# Patient Record
Sex: Female | Born: 1990 | Hispanic: Yes | Marital: Married | Smoking: Never smoker
Health system: Southern US, Community
[De-identification: ages and names within clinical notes are randomized; demographics above are authoritative.]

## PROBLEM LIST (undated history)

## (undated) DIAGNOSIS — E282 Polycystic ovarian syndrome: Secondary | ICD-10-CM

## (undated) HISTORY — DX: Polycystic ovarian syndrome: E28.2

## (undated) HISTORY — PX: NO PAST SURGERIES: SHX2092

---

## 2015-11-02 ENCOUNTER — Ambulatory Visit (INDEPENDENT_AMBULATORY_CARE_PROVIDER_SITE_OTHER): Payer: BLUE CROSS/BLUE SHIELD

## 2015-11-02 ENCOUNTER — Encounter: Payer: Self-pay | Admitting: Obstetrics and Gynecology

## 2015-11-02 ENCOUNTER — Ambulatory Visit (INDEPENDENT_AMBULATORY_CARE_PROVIDER_SITE_OTHER): Payer: BLUE CROSS/BLUE SHIELD | Admitting: Obstetrics and Gynecology

## 2015-11-02 VITALS — BP 122/82 | HR 91 | Ht 63.0 in | Wt 194.9 lb

## 2015-11-02 DIAGNOSIS — E669 Obesity, unspecified: Secondary | ICD-10-CM | POA: Diagnosis not present

## 2015-11-02 DIAGNOSIS — N970 Female infertility associated with anovulation: Secondary | ICD-10-CM | POA: Diagnosis not present

## 2015-11-02 DIAGNOSIS — E282 Polycystic ovarian syndrome: Secondary | ICD-10-CM | POA: Diagnosis not present

## 2015-11-02 DIAGNOSIS — E66811 Obesity, class 1: Secondary | ICD-10-CM

## 2015-11-02 MED ORDER — MEDROXYPROGESTERONE ACETATE 10 MG PO TABS: 10.0000 mg | ORAL_TABLET | Freq: Every day | ORAL | Status: DC

## 2015-11-02 NOTE — Patient Instructions (Signed)
1. You will begin a medication called Provera.  You will take this medication for 7 days each month.  Begin next dose approximately 21 days from initial dose each month.  2. You will need to keep a menstrual calendar.  Be sure to mark the first day of your cycle each month.  3. On the 3rd day of your cycle (3rd day of bleeding) and 21st day of your cycle, you will need to return for lab work.  4. You will be scheduled for an ultrasound a to evaluate your pelvic organs. 5. Once you have begun having regular cycles, you will need to be sure to have intercourse at least once daily specificall on Days 11-14 of your cycle.  Do not immediately get up after intercourse, remain lying down for 30 minutes.

## 2015-11-02 NOTE — Progress Notes (Signed)
GYNECOLOGY CLINIC PROGRESS NOTE  Subjective:    Kristina Rubio is a 24 y.o. female who presents for evaluation of infertility. Patient and partner have been attempting conception for 2 years.  Patient's last menstrual period was 08/21/2015 (within days).    Marital status: single. Pregnancies with current partner: no.  Menstrual and Endocrine History LMP Patient's last menstrual period was 08/21/2015 (within days).  Menarche 13  Shortest interval 27  Longest interval 70  days  Duration of flow 3-14 days  Heavy menses no  Clots yes  Intermenstrual bleeding no  Postcoital bleeding no  Dysmenorrhea yes (mild)  Amenorrhea yes for 2 cycles  Weight change yes, started at ~ 2 years ago  Hirsutism no  Balding no  Acne no  Galactorrhea no   Obstetrical History Never pregnant  Gynecologic History Last PAP 01/2015  Previous abdominal or pelvic surgery no  Pelvic pain no  Endometriosis no  Hot flashes no  DES exposure no  Abnormal Pap no  Cervix Cryo/cone no  Sexually transmitted diseases no  Pelvic inflammatory disease no   Infertility and Endocrine Studies Patient has not had any infertility workup thus far.  Does report workup for PCOS (labs).  Notes being told of "elevated testosterone"   Sexual History Frequency 3 or 4 times per weeks  Satisfied yes  Dyspareunia yes, deep  Use of lubricant no  Douching no  Number of lifetime sex partners 3   Contraception Other: h/o patch, OCP use  Family History Thyroid problems  no  Heart condition or high blood pressure  no  Blood clot or stroke  no  Diabetes  no  Cancer  no  Birth defects/inherited diseases  no  Infectious diseases (mumps, TB, rubella)  no  Other medical problems  no   Habits Cigarettes:    Female-  no    Partner - no Alcohol:    Female -  yes, occasional    Partner - yes, occasional Marijuana:   Female - no   Partner - no   Past Medical History  Diagnosis Date  . PCOS (polycystic ovarian  syndrome)     Family History  Problem Relation Age of Onset  . Anemia Mother     History reviewed. No pertinent past surgical history.   Social History   Social History  . Marital Status: Single    Spouse Name: N/A  . Number of Children: N/A  . Years of Education: N/A   Occupational History  . Not on file.   Social History Main Topics  . Smoking status: Never Smoker   . Smokeless tobacco: Not on file  . Alcohol Use: No  . Drug Use: No  . Sexual Activity: Yes    Birth Control/ Protection: None   Other Topics Concern  . Not on file   Social History Narrative  . No narrative on file    No Known Allergies  No current outpatient prescriptions on file prior to visit.   No current facility-administered medications on file prior to visit.    Review of Systems Pertinent items noted in HPI and remainder of comprehensive ROS otherwise negative.   Female History and Exam Name:  Kristina Rubio     Age: [age] Education: Some high school  Marital History: co-habitating # of years with this partner: 3   Paternity of Pregnancies: Number with this partner: 0 Number with other partners: 0 Age of youngest child: not applicable  Urologic History: Infection no  STD no  Mumps no  Varicocele no  Semen analysis no  Undescended testes no  Testicular trauma no  Genital surgery no  Ejaculatory problem no  Impotence no         Objective:    Female Exam BP 122/82 mmHg  Pulse 91  Ht  (1.6 m)  Wt 194 lb 14.4 oz (88.406 kg)  BMI 34.53 kg/m2  LMP 08/21/2015 (Within Days) Wt Readings from Last 1 Encounters:  11/02/15 194 lb 14.4 oz (88.406 kg)   General Appearance:    Alert, cooperative, no distress, appears stated age, obese  Head:    Normocephalic, without obvious abnormality, atraumatic  Eyes:    PERRL, conjunctiva/corneas clear, EOM's intact, both eyes  Ears:    Normal external ear canals, both ears  Nose:   Nares normal, septum midline, mucosa normal, no  drainage or sinus tenderness  Throat:   Lips, mucosa, and tongue normal; teeth and gums normal  Neck:   Supple, symmetrical, trachea midline, no adenopathy; thyroid:  no enlargement/tenderness/nodules; no carotid bruit or JVD  Back:     Symmetric, no curvature, ROM normal, no CVA tenderness  Lungs:     Clear to auscultation bilaterally, respirations unlabored  Chest Wall:    No tenderness or deformity   Heart:    Regular rate and rhythm, S1 and S2 normal, no murmur, rub or gallop  Breast Exam:    No tenderness, masses, or nipple abnormality  Abdomen:     Soft, non-tender, bowel sounds active all four quadrants, no masses, no organomegaly  Genitalia:    Normal female external genitalia, vagina with normal mucosa, scant white thin discharge, no odor.  Rectovaginal septum normal.  Cervix normal appearing, nulliparous, without lesion, discharge or tenderness.  Uterus normal size, shape, mobile.  Adnexae without masses or tenderness bilaterally.   Rectal:    deferred  Extremities:   Extremities normal, atraumatic, no cyanosis or edema  Pulses:   2+ and symmetric all extremities  Skin:   Skin color, texture, turgor normal, no rashes or lesions  Lymph nodes:   Cervical, supraclavicular, and axillary nodes normal  Neurologic:   CNII-XII intact, normal strength, sensation and reflexes    throughout     Female Exam Female genital exam: not performed   Assessment:    Irregular periods due to ovulation factor and obesity.   PCOS  Plan:    FSH, LH, Progesterone 3 times in midluteal phase and Ultrasound of pelvis. Will need to get records of labs from [facility].  Discussed use of Provera tablets monthly to make cycles occur regularly.  Discussed timed coitus, weight management, period tracking. All questions answered. Educational materials given to patient. Follow up in 3 months.     Hildred Laser, MD [facility]

## 2015-11-11 ENCOUNTER — Telehealth: Payer: Self-pay | Admitting: Obstetrics and Gynecology

## 2015-11-11 NOTE — Telephone Encounter (Signed)
Dr.Cherry it looks like you have reviewed this, can you tell me how to advise patient.

## 2015-11-11 NOTE — Telephone Encounter (Signed)
LM for pt informing her of u/s findings and the need to follow up.

## 2015-11-11 NOTE — Telephone Encounter (Signed)
-----   Message from Hildred LaserAnika Cherry, MD sent at 11/11/2015 11:22 AM EST ----- Please inform patient that her ultrasound does appear to have findings significant for PCOS.  Can continue on current course of treatment (Provera for cycle induction), and f/u at next scheduled appointment.

## 2015-11-11 NOTE — Telephone Encounter (Signed)
PT CALLED AND WAS SEEN ABOUT A MONTH AGO, AND SHE WAS HERE FOR AN US ON 11/30 AND SHE HAS NOT HEARD BACK ABOUT THE RESULTS OF IT.

## 2015-12-27 ENCOUNTER — Telehealth: Payer: Self-pay | Admitting: Obstetrics and Gynecology

## 2015-12-27 NOTE — Telephone Encounter (Signed)
Started the provera back in november and she has taken it every month since and she is not sure if she needs to take it the 31st of this month because she has started spotting. She normally starts the 31st of every month and she is just unsure since she has started spotting does she need to take it, she is unsure what Dr Valentino Saxon told her back in Nov.

## 2015-12-28 NOTE — Telephone Encounter (Signed)
Called pt Kristina Rubio for her informing her that Provera is to initiate her cycle, therefore if she started her cycle without the medication there is no need to take it. Advised pt to call back if she has more questions.

## 2016-03-01 ENCOUNTER — Ambulatory Visit: Payer: BLUE CROSS/BLUE SHIELD | Admitting: Obstetrics and Gynecology

## 2016-03-22 ENCOUNTER — Ambulatory Visit (INDEPENDENT_AMBULATORY_CARE_PROVIDER_SITE_OTHER): Payer: Managed Care, Other (non HMO) | Admitting: Obstetrics and Gynecology

## 2016-03-22 VITALS — BP 132/78 | HR 86 | Ht 63.0 in | Wt 191.4 lb

## 2016-03-22 DIAGNOSIS — N970 Female infertility associated with anovulation: Secondary | ICD-10-CM

## 2016-03-22 DIAGNOSIS — E282 Polycystic ovarian syndrome: Secondary | ICD-10-CM | POA: Diagnosis not present

## 2016-03-22 MED ORDER — CLOMIPHENE CITRATE 50 MG PO TABS: 50.0000 mg | ORAL_TABLET | Freq: Every day | ORAL | Status: DC

## 2016-03-24 ENCOUNTER — Encounter: Payer: Self-pay | Admitting: Obstetrics and Gynecology

## 2016-03-24 NOTE — Progress Notes (Signed)
    GYNECOLOGY PROGRESS NOTE  Subjective:    Patient ID: Ellwood DenseStephanie Saldana, female    DOB: 12/31/1990, 25 y.o.   MRN: [id number]  HPI  Patient is a 25 y.o. G0P0000 female who presents for 3 month f/u of infertility and anovulation.  Patient notes that cycles have been regular with use of Provera tablets, however most recent cycle lasted only 2 days (usually lasts 4-5). Patient's last menstrual period was 03/18/2016.  Is performing timed coitus around fertile period and tracking menstrual cycles.    The following portions of the patient's history were reviewed and updated as appropriate: allergies, current medications, past family history, past medical history, past social history, past surgical history and problem list.  Review of Systems Pertinent items noted in HPI and remainder of comprehensive ROS otherwise negative.   Objective:   Blood pressure 132/78, pulse 86, height 5\' 3"  (1.6 m), weight 191 lb 6.4 oz (86.818 kg), last menstrual period 03/18/2016. General appearance: alert and no distress Exam deferred.   Assessment:  Infertility associated with anovulation PCOS (polycystic ovarian syndrome)  Plan:   Patient instructed to take pregnancy test if menstrual cycle does not self initiate, prior to starting next round of Provera tablets.  Also, can begin with Clomid induction. Prescribed Clomid 50 mg.  Given instructions on use of Clomid, reviewed side effects.  To f/u in 3-6 additional months. Will increase dose if no conception occurs.    Hildred LaserAnika Ivery Michalski, MD [facility]

## 2016-06-11 ENCOUNTER — Other Ambulatory Visit: Payer: Self-pay | Admitting: Obstetrics and Gynecology

## 2016-08-16 ENCOUNTER — Telehealth: Payer: Self-pay | Admitting: Obstetrics and Gynecology

## 2016-08-16 NOTE — Telephone Encounter (Signed)
PT CALLED AND SHE WAS TOLD TO CALL DR CHERRY IF THE MEDICATION SHE WAS PUT ON WAS NOT DOING ANYTHING AND SHE WOULD INCREASE THE DOSE, AND PT IS STATING THAT THE MEDICATION IS NOT DOING ANYTHING, SHE IS ON A MEDICATION TO HELP WITH GETTING PREGNANT, PT DID STATE SHE WOULD LIKE  A CALL BACK.

## 2016-08-16 NOTE — Telephone Encounter (Signed)
Called pt she states that she was told if medication was not working to achieve pregnancy Dr.Cherry would increase dose. Advised pt that she is due for her f/u appointment and medication can be addressed at that visit.

## 2016-09-20 ENCOUNTER — Other Ambulatory Visit: Payer: Self-pay | Admitting: Obstetrics and Gynecology

## 2016-09-20 MED ORDER — CLOMIPHENE CITRATE 50 MG PO TABS: 100.0000 mg | ORAL_TABLET | Freq: Every day | ORAL | 4 refills | Status: DC

## 2016-09-25 ENCOUNTER — Ambulatory Visit: Payer: Managed Care, Other (non HMO) | Admitting: Obstetrics and Gynecology

## 2016-09-25 ENCOUNTER — Ambulatory Visit (INDEPENDENT_AMBULATORY_CARE_PROVIDER_SITE_OTHER): Payer: Managed Care, Other (non HMO) | Admitting: Obstetrics and Gynecology

## 2016-10-03 ENCOUNTER — Ambulatory Visit (INDEPENDENT_AMBULATORY_CARE_PROVIDER_SITE_OTHER): Payer: Managed Care, Other (non HMO) | Admitting: Obstetrics and Gynecology

## 2016-10-03 ENCOUNTER — Encounter: Payer: Self-pay | Admitting: Obstetrics and Gynecology

## 2016-10-03 VITALS — BP 111/77 | HR 80 | Wt 191.0 lb

## 2016-10-03 DIAGNOSIS — N970 Female infertility associated with anovulation: Secondary | ICD-10-CM

## 2016-10-03 DIAGNOSIS — E282 Polycystic ovarian syndrome: Secondary | ICD-10-CM

## 2016-10-03 DIAGNOSIS — Z319 Encounter for procreative management, unspecified: Secondary | ICD-10-CM | POA: Diagnosis not present

## 2016-10-03 MED ORDER — METFORMIN HCL 500 MG PO TABS: ORAL_TABLET | ORAL | 5 refills | Status: DC

## 2016-10-03 NOTE — Progress Notes (Signed)
    GYNECOLOGY PROGRESS NOTE  Subjective:    Patient ID: Kristina Rubio, female    DOB: 05/28/1991, 25 y.o.   MRN: [id number]  HPI  Patient is a 25 y.o. G0P0000 female who presents for f/u of infertility.  Patient is currently undergoing treatment for infertility with Clomid. She has been on Clomid 50 mg for the past 3 months and has now increased to 100 mg this month. Patient still noting that she has to take Provera to bring on menses. Has not completed day 21 progesterone levels and is currently not using an ovulation kits. Patient notes that she is engaging in timed coitus around time of anticipated ovulation. Is using a phone app to track her menstrual cycle.  Patient's last menstrual period was 08/31/2016.   The following portions of the patient's history were reviewed and updated as appropriate: allergies, current medications, past family history, past medical history, past social history, past surgical history and problem list.  Review of Systems Pertinent items noted in HPI and remainder of comprehensive ROS otherwise negative.   Objective:   Blood pressure 111/77, pulse 80, weight 191 lb (86.6 kg), last menstrual period 09/30/2016. 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 We'll also prescribe metformin as adjuvant therapy. Discussed the importance of patient either having day 21 progesterone draws, or using an ovulation kit to verify that she is indeed ovulating while on this medication. Patient notes understanding.  Timed coitus, with intercourse occurring between days 10 15 of her menstrual cycle. - To continue using Provera for inducing withdrawal bleed each month. - If no success with medication the next 2-3 months, will have patient stop medication for a while and continue with further workup (i.e. semen analysis, HSG).   Can also consider switching to alternative ovulation induction medication, such as Femara.  A total of 15 minutes were spent face-to-face with the patient during this encounter and over half of that time dealt with counseling and coordination of care.    , MD [facility]  

## 2016-10-11 ENCOUNTER — Telehealth: Payer: Self-pay | Admitting: Obstetrics and Gynecology

## 2016-10-11 NOTE — Telephone Encounter (Signed)
PT WAS TOLD TO TAKE MEDICATION WHEN SHE STARTED HER PERIOD BUT SHE IS UNSURE IF SHE HAS STARTED, LITTLE TO NO BLEEDING, ONE DAY THERE WHEN SHE WIPES NEXT DAY NOTHING, VERY LIGHT PINK, AND SHE IS SUPPOSE TO GET BLOOD WORK ON DAY 21 TOO SHE IS JUST NOT SURE IF THIS IS HER PERIOD OR AND IF SHE COUNTS THIS AS ONE, SHE WOULD LIKE A CALL.

## 2016-10-11 NOTE — Telephone Encounter (Signed)
Please advise 

## 2016-10-11 NOTE — Telephone Encounter (Signed)
If she is unsure that she had a period this month, please inform her to wait until the following month to restart the medications.  I would advise her to take a pregnancy test prior to resuming the medications next month.  She can also skip the Day 21 lab draw this month as she is not taking the meds.

## 2016-10-12 NOTE — Telephone Encounter (Signed)
Called pt no answer, LM for pt informing her of the information below. 

## 2016-10-15 ENCOUNTER — Telehealth: Payer: Self-pay | Admitting: Obstetrics and Gynecology

## 2016-10-15 NOTE — Telephone Encounter (Signed)
Pt needs help figuring when she should get her 21 day labs drawn, she spotted some then started her period 2 wks later. Please call.

## 2016-10-15 NOTE — Telephone Encounter (Signed)
Called pt no answer. LM for pt informing her that day 1 is the first day of her period and she just needs to count 20 days from there. Advised pt to call back for questions also sent text to sign up for mychart.

## 2017-01-31 ENCOUNTER — Encounter: Payer: Managed Care, Other (non HMO) | Admitting: Obstetrics and Gynecology

## 2017-02-06 ENCOUNTER — Ambulatory Visit (INDEPENDENT_AMBULATORY_CARE_PROVIDER_SITE_OTHER): Payer: 59 | Admitting: Obstetrics and Gynecology

## 2017-02-06 VITALS — BP 104/65 | HR 79 | Ht 63.0 in | Wt 184.2 lb

## 2017-02-06 DIAGNOSIS — E282 Polycystic ovarian syndrome: Secondary | ICD-10-CM | POA: Diagnosis not present

## 2017-02-06 DIAGNOSIS — N970 Female infertility associated with anovulation: Secondary | ICD-10-CM | POA: Diagnosis not present

## 2017-02-06 NOTE — Progress Notes (Signed)
    GYNECOLOGY PROGRESS NOTE  Subjective:    Patient ID: Kristina DenseStephanie Saldana, female    DOB: 1990-12-18, 26 y.o.   MRN: [id number]  HPI  Patient is a 26 y.o. G0P0000 female who presents for f/u of infertility.  Patient is currently undergoing treatment for infertility with Clomid. She has been on Clomid 100 mg for the past 3 months.  Has also initiated Metformin since last visit.  Notes that over the past 3 months since starting this medication, her cycles have started spontaneously, has not required use of Provera.  Has not completed day 21 progesterone levels and is currently not using an ovulation kits (notes she tried once but got confused). Patient notes that she is engaging in timed coitus around time of anticipated ovulation. Is using a phone app to track her menstrual cycle.  Patient's last menstrual period was 01/30/2017.   The following portions of the patient's history were reviewed and updated as appropriate: allergies, current medications, past family history, past medical history, past social history, past surgical history and problem list.  Review of Systems Pertinent items noted in HPI and remainder of comprehensive ROS otherwise negative.   Objective:   Blood pressure 104/65, pulse 79, height 5\' 3"  (1.6 m), weight 184 lb 3.2 oz (83.6 kg), last menstrual period 01/30/2017. General appearance: alert and no distress Exam deferred.    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 To continue metformin as adjuvant therapy. Discussed the importance of patient either having day 21 progesterone draw Timed coitus, with intercourse occurring between days 10-15 of her menstrual cycle. - Cycles now spontaneously occurring, no longer requires Provera.  - Since patient is still with no pregnancy after 6 months of clomid, will continue with further workup (i.e. semen analysis, HSG). Discussed nature of testing, how to  perform, and reasons for performing.   - Can also consider switching to alternative ovulation induction medication, such as Femara after 3 month rest. - Patient with h/o PCOS however no labs ever received from [facility].  Ultrasound did not appearance of PCOS in 2016. Will reorder labs today.    A total of 15 minutes were spent face-to-face with the patient during this encounter and over half of that time dealt with counseling and coordination of care.   Hildred LaserAnika Keta [place], MD [facility]

## 2017-02-06 NOTE — Patient Instructions (Addendum)
Patient to be scheduled for HSG (Hysterosalpingogram) next month (1 week after onset of next menstrual cycle).  To discontinue Clomid after this month.   Continue timed coitus (intercourse on Days 10-14 of each month of cycle).  To continue use of Metformin.  Given information on semen analysis for partner.  Return on Day 21 for progesterone levels.

## 2017-02-12 ENCOUNTER — Telehealth: Payer: Self-pay | Admitting: Obstetrics and Gynecology

## 2017-02-12 DIAGNOSIS — E282 Polycystic ovarian syndrome: Secondary | ICD-10-CM

## 2017-02-12 MED ORDER — METFORMIN HCL 500 MG PO TABS: ORAL_TABLET | ORAL | 5 refills | Status: DC

## 2017-02-12 NOTE — Telephone Encounter (Signed)
[facility]. LM for pt informing her of this.

## 2017-02-12 NOTE — Telephone Encounter (Signed)
Patient needs to switch to the outpatient pharmacy [facility]. [facility] had to [facility]. She [facility] the [facility]. Patient would like to be called when this is done. Please advise

## 2017-02-14 DIAGNOSIS — E669 Obesity, unspecified: Secondary | ICD-10-CM | POA: Diagnosis not present

## 2017-02-14 DIAGNOSIS — Z0000 Encounter for general adult medical examination without abnormal findings: Secondary | ICD-10-CM | POA: Diagnosis not present

## 2017-02-14 DIAGNOSIS — N898 Other specified noninflammatory disorders of vagina: Secondary | ICD-10-CM | POA: Diagnosis not present

## 2017-02-14 DIAGNOSIS — Z1389 Encounter for screening for other disorder: Secondary | ICD-10-CM | POA: Diagnosis not present

## 2017-02-14 DIAGNOSIS — Z3200 Encounter for pregnancy test, result unknown: Secondary | ICD-10-CM | POA: Diagnosis not present

## 2017-02-14 DIAGNOSIS — Z23 Encounter for immunization: Secondary | ICD-10-CM | POA: Diagnosis not present

## 2017-02-17 LAB — TESTOSTERONE, FREE, TOTAL, SHBG
Sex Hormone Binding: 24.3 nmol/L — ABNORMAL LOW (ref 24.6–122.0)
TESTOSTERONE FREE: 2.3 pg/mL (ref 0.0–4.2)
Testosterone: 43 ng/dL (ref 8–48)

## 2017-02-17 LAB — LIPID PANEL
Chol/HDL Ratio: 4 ratio units (ref 0.0–4.4)
Cholesterol, Total: 164 mg/dL (ref 100–199)
HDL: 41 mg/dL (ref >39)
LDL Calculated: 84 mg/dL (ref 0–99)
Triglycerides: 195 mg/dL — ABNORMAL HIGH (ref 0–149)
VLDL Cholesterol Cal: 39 mg/dL (ref 5–40)

## 2017-02-17 LAB — FSH/LH
FSH: 8.4 m[IU]/mL
LH: 14.1 m[IU]/mL

## 2017-02-17 LAB — DHEA-SULFATE: DHEA-SO4: 479.9 ug/dL — ABNORMAL HIGH (ref 110.0–431.7)

## 2017-02-17 LAB — INSULIN, FREE AND TOTAL
FREE INSULIN: 17 uU/mL
TOTAL INSULIN: 17 uU/mL

## 2017-02-17 LAB — TSH: TSH: 2.29 u[IU]/mL (ref 0.450–4.500)

## 2017-02-17 LAB — PROLACTIN: PROLACTIN: 33.6 ng/mL — AB (ref 4.8–23.3)

## 2017-02-18 ENCOUNTER — Telehealth: Payer: Self-pay

## 2017-02-18 NOTE — Telephone Encounter (Signed)
Called pt informed her of the need to come in and discuss lab results, pt transferred to reception to be scheduled.

## 2017-02-18 NOTE — Telephone Encounter (Signed)
-----   Message from Hildred LaserAnika Cherry, MD sent at 02/18/2017  9:23 AM EDT ----- Please have patient to come in for f/u appointment to discuss lab results as she has several abnormalities.

## 2017-02-19 ENCOUNTER — Encounter: Payer: Self-pay | Admitting: Obstetrics and Gynecology

## 2017-02-19 ENCOUNTER — Ambulatory Visit (INDEPENDENT_AMBULATORY_CARE_PROVIDER_SITE_OTHER): Payer: 59 | Admitting: Obstetrics and Gynecology

## 2017-02-19 VITALS — BP 125/75 | HR 109 | Ht 63.0 in | Wt 186.3 lb

## 2017-02-19 DIAGNOSIS — N970 Female infertility associated with anovulation: Secondary | ICD-10-CM | POA: Diagnosis not present

## 2017-02-19 DIAGNOSIS — E229 Hyperfunction of pituitary gland, unspecified: Secondary | ICD-10-CM

## 2017-02-19 DIAGNOSIS — E781 Pure hyperglyceridemia: Secondary | ICD-10-CM

## 2017-02-19 DIAGNOSIS — E282 Polycystic ovarian syndrome: Secondary | ICD-10-CM

## 2017-02-19 DIAGNOSIS — E288 Other ovarian dysfunction: Secondary | ICD-10-CM | POA: Diagnosis not present

## 2017-02-19 DIAGNOSIS — R7989 Other specified abnormal findings of blood chemistry: Secondary | ICD-10-CM

## 2017-02-19 NOTE — Progress Notes (Signed)
GYNECOLOGY PROGRESS NOTE  Subjective:    Patient ID: Kristina Rubio, female    DOB: 1991/04/30, 26 y.o.   MRN: [id number]  HPI  Patient is a 26 y.o. G0P0000 female who presents for follow-up of lab results.  Patient is currently dealing with infertility. Has been on ovulation induction medication (Provera, Metformin, and Clomid) for almost 6 months with no success. Patient reports a history of PCOS, notes previous labwork done at a previous clinic (reported normal), however no labs were ever received upon request. Labs were redrawn last visit.  The following portions of the patient's history were reviewed and updated as appropriate: allergies, current medications, past family history, past medical history, past social history, past surgical history and problem list.  Review of Systems Pertinent items noted in HPI and remainder of comprehensive ROS otherwise negative.   Objective:   Blood pressure 125/75, pulse (!) 109, height 5\' 3"  (1.6 m), weight 186 lb 4.8 oz (84.5 kg), last menstrual period 01/30/2017. General appearance: alert and no distress Remainder of exam deferred.   Labs:  Office Visit on 02/06/2017  Component Date Value Ref Range Status  . LH 02/06/2017 14.1  mIU/mL Final   Comment:                     Adult Female:                       Follicular phase      2.4 -  12.6                       Ovulation phase      14.0 -  95.6                       Luteal phase          1.0 -  11.4                       Postmenopausal        7.7 -  58.5   . [facility] 02/06/2017 8.4  mIU/mL Final   Comment:                     Adult Female:                       Follicular phase      3.5 -  12.5                       Ovulation phase       4.7 -  21.5                       Luteal phase          1.7 -   7.7                       Postmenopausal       25.8 - 134.8   . Testosterone 02/06/2017 43  8 - 48 ng/dL Final  . Testosterone, Free 02/06/2017 2.3  0.0 - 4.2 pg/mL Final  . Sex Hormone  Binding 02/06/2017 24.3* 24.6 - 122.0 nmol/L Final  . DHEA-SO4 02/06/2017 479.9* 110.0 - 431.7 ug/dL Final  . Free Insulin 40/98/1191 17  uU/mL Final   Comment: Reference Range: Pubertal Children  and Adults (fasting): 0 - 17   . Total Insulin 02/06/2017 17  uU/mL Final   Comment: Non-Diabetic:  In the absence of insulin-binding antibodies, the free and total insulin assays are equivalent. However, this assay is intended for use in diabetics with insulin autoantibody present. Measurement is performed on acid-treated samples and, therefore, the sensitivity and absolute values by this method may differ from our direct insulin ICMA. Insulin Dependent Diabetic Patients:  Free Insulin levels vary depending on the capacity and affinity of circulating insulin-binding antibodies and the dose of insulin given to the patient.  Total insulin levels represent free insulin and antibody bound insulin fractions.   . Cholesterol, Total 02/06/2017 164  100 - 199 mg/dL Final  . Triglycerides 02/06/2017 195* 0 - 149 mg/dL Final  . HDL 16/10/960403/06/2017 41  >39 mg/dL Final  . VLDL Cholesterol Cal 02/06/2017 39  5 - 40 mg/dL Final  . LDL Calculated 02/06/2017 84  0 - 99 mg/dL Final  . Chol/HDL Ratio 02/06/2017 4.0  0.0 - 4.4 ratio units Final   Comment:                                   T. Chol/HDL Ratio                                             Men  Women                               1/2 Avg.Risk  3.4    3.3                                   Avg.Risk  5.0    4.4                                2X Avg.Risk  9.6    7.1                                3X Avg.Risk 23.4   11.0   . TSH 02/06/2017 2.290  0.450 - 4.500 uIU/mL Final  . Prolactin 02/06/2017 33.6* 4.8 - 23.3 ng/mL Final    Assessment:   Infertility History of PCOS Elevated triglycerides Elevated prolactin Mild hyperandrogenism  Plan:   - Reviewed all labs with patient. Hyperandrogenism (mild) is common with PCOS, however is not the likely  cause of her infertility. Patient does however have an elevated prolactin level, and this could be a hindrance to her fertility. We'll repeat prolactin levels and approximately 4-6 weeks. Will also refer to endocrinologist for further management.  To discontinue Clomid at this time. - Elevated triglycerides noted on recent labs, however this is a mild elevation. Does not require any intervention other than dietary modification and encouraging exercise. - Patient to follow up in 4 weeks for repeat prolactin levels. - Will hold on performing HSG until after evaluation by endocrinologist.   A total of 15 minutes were spent face-to-face with the patient during this encounter and over half of that time dealt with counseling  and coordination of care.  Hildred Laser, MD [facility]

## 2017-02-20 ENCOUNTER — Encounter: Payer: Self-pay | Admitting: Obstetrics and Gynecology

## 2017-03-06 ENCOUNTER — Ambulatory Visit: Payer: Self-pay

## 2017-03-13 ENCOUNTER — Ambulatory Visit: Payer: Self-pay

## 2017-03-19 ENCOUNTER — Other Ambulatory Visit: Payer: 59

## 2017-03-19 ENCOUNTER — Other Ambulatory Visit: Payer: Self-pay | Admitting: *Deleted

## 2017-03-19 DIAGNOSIS — E282 Polycystic ovarian syndrome: Secondary | ICD-10-CM | POA: Diagnosis not present

## 2017-03-19 DIAGNOSIS — Z113 Encounter for screening for infections with a predominantly sexual mode of transmission: Secondary | ICD-10-CM | POA: Diagnosis not present

## 2017-03-20 ENCOUNTER — Encounter: Payer: Self-pay | Admitting: Obstetrics and Gynecology

## 2017-03-20 LAB — PROLACTIN: Prolactin: 48.2 ng/mL — ABNORMAL HIGH (ref 4.8–23.3)

## 2017-03-21 ENCOUNTER — Other Ambulatory Visit: Payer: Self-pay

## 2017-03-21 DIAGNOSIS — E229 Hyperfunction of pituitary gland, unspecified: Principal | ICD-10-CM

## 2017-03-21 DIAGNOSIS — R7989 Other specified abnormal findings of blood chemistry: Secondary | ICD-10-CM

## 2017-03-27 DIAGNOSIS — L819 Disorder of pigmentation, unspecified: Secondary | ICD-10-CM | POA: Diagnosis not present

## 2017-03-27 DIAGNOSIS — N898 Other specified noninflammatory disorders of vagina: Secondary | ICD-10-CM | POA: Diagnosis not present

## 2017-03-27 DIAGNOSIS — Z3200 Encounter for pregnancy test, result unknown: Secondary | ICD-10-CM | POA: Diagnosis not present

## 2017-03-27 DIAGNOSIS — Z1389 Encounter for screening for other disorder: Secondary | ICD-10-CM | POA: Diagnosis not present

## 2017-04-24 DIAGNOSIS — E282 Polycystic ovarian syndrome: Secondary | ICD-10-CM | POA: Diagnosis not present

## 2017-04-24 DIAGNOSIS — N926 Irregular menstruation, unspecified: Secondary | ICD-10-CM | POA: Diagnosis not present

## 2017-04-24 DIAGNOSIS — L83 Acanthosis nigricans: Secondary | ICD-10-CM | POA: Diagnosis not present

## 2017-04-24 DIAGNOSIS — E221 Hyperprolactinemia: Secondary | ICD-10-CM | POA: Diagnosis not present

## 2017-05-01 ENCOUNTER — Other Ambulatory Visit: Payer: Self-pay | Admitting: Internal Medicine

## 2017-05-01 DIAGNOSIS — E221 Hyperprolactinemia: Secondary | ICD-10-CM

## 2017-05-07 ENCOUNTER — Ambulatory Visit: Payer: 59

## 2017-05-09 ENCOUNTER — Other Ambulatory Visit: Payer: Self-pay | Admitting: Internal Medicine

## 2017-05-09 DIAGNOSIS — E221 Hyperprolactinemia: Secondary | ICD-10-CM

## 2017-05-20 ENCOUNTER — Ambulatory Visit
Admission: RE | Admit: 2017-05-20 | Discharge: 2017-05-20 | Disposition: A | Discharge status: Home / Self Care | Payer: 59 | Source: Ambulatory Visit | Attending: Internal Medicine | Admitting: Internal Medicine

## 2017-05-20 DIAGNOSIS — E221 Hyperprolactinemia: Secondary | ICD-10-CM | POA: Diagnosis not present

## 2017-05-20 MED ORDER — GADOBENATE DIMEGLUMINE 529 MG/ML IV SOLN
10.0000 mL | Freq: Once | INTRAVENOUS | Status: AC | PRN
  Administered 2017-05-20: 10 mL via INTRAVENOUS

## 2017-07-25 DIAGNOSIS — E221 Hyperprolactinemia: Secondary | ICD-10-CM | POA: Diagnosis not present

## 2017-07-25 DIAGNOSIS — E282 Polycystic ovarian syndrome: Secondary | ICD-10-CM | POA: Diagnosis not present

## 2017-07-25 DIAGNOSIS — N926 Irregular menstruation, unspecified: Secondary | ICD-10-CM | POA: Diagnosis not present

## 2017-07-25 DIAGNOSIS — L83 Acanthosis nigricans: Secondary | ICD-10-CM | POA: Diagnosis not present

## 2017-07-30 DIAGNOSIS — E282 Polycystic ovarian syndrome: Secondary | ICD-10-CM | POA: Diagnosis not present

## 2017-07-30 DIAGNOSIS — E221 Hyperprolactinemia: Secondary | ICD-10-CM | POA: Diagnosis not present

## 2017-07-31 DIAGNOSIS — E282 Polycystic ovarian syndrome: Secondary | ICD-10-CM | POA: Diagnosis not present

## 2017-07-31 DIAGNOSIS — E221 Hyperprolactinemia: Secondary | ICD-10-CM | POA: Diagnosis not present

## 2017-09-02 ENCOUNTER — Telehealth: Payer: Self-pay | Admitting: Obstetrics and Gynecology

## 2017-09-02 DIAGNOSIS — E282 Polycystic ovarian syndrome: Secondary | ICD-10-CM

## 2017-09-02 MED ORDER — METFORMIN HCL 500 MG PO TABS: ORAL_TABLET | ORAL | 5 refills | Status: DC

## 2017-09-02 NOTE — Telephone Encounter (Signed)
Pt contacted office for refill request on the following medications: metFORMIN (GLUCOPHAGE) 500 MG tablet  [facility] [place] [facility].  Please advise. Thanks TNP

## 2017-09-05 ENCOUNTER — Encounter: Payer: Self-pay | Admitting: Obstetrics and Gynecology

## 2017-09-19 ENCOUNTER — Telehealth: Payer: Self-pay | Admitting: Obstetrics and Gynecology

## 2017-09-19 NOTE — Telephone Encounter (Signed)
Attempted to reach pt, no answer, no vm set up yet cup placed up front

## 2017-09-19 NOTE — Telephone Encounter (Signed)
Patient called stating she needs another sterile cup for husband to give a semen specimen. Thanks

## 2017-09-24 NOTE — Telephone Encounter (Signed)
Called pt and she states she has picked up cup and she did not need anything further. Nothing further is needed

## 2017-09-30 ENCOUNTER — Encounter: Payer: Self-pay | Admitting: Obstetrics and Gynecology

## 2017-09-30 ENCOUNTER — Ambulatory Visit (INDEPENDENT_AMBULATORY_CARE_PROVIDER_SITE_OTHER): Payer: 59 | Admitting: Obstetrics and Gynecology

## 2017-09-30 VITALS — BP 125/85 | HR 80 | Ht 63.0 in | Wt 193.2 lb

## 2017-09-30 DIAGNOSIS — E282 Polycystic ovarian syndrome: Secondary | ICD-10-CM | POA: Diagnosis not present

## 2017-09-30 DIAGNOSIS — E288 Other ovarian dysfunction: Secondary | ICD-10-CM | POA: Diagnosis not present

## 2017-09-30 DIAGNOSIS — N912 Amenorrhea, unspecified: Secondary | ICD-10-CM | POA: Diagnosis not present

## 2017-09-30 DIAGNOSIS — E669 Obesity, unspecified: Secondary | ICD-10-CM

## 2017-09-30 DIAGNOSIS — N970 Female infertility associated with anovulation: Secondary | ICD-10-CM

## 2017-09-30 LAB — POCT URINE PREGNANCY: Preg Test, Ur: NEGATIVE

## 2017-09-30 MED ORDER — LETROZOLE 2.5 MG PO TABS: 2.5000 mg | ORAL_TABLET | Freq: Every day | ORAL | 1 refills | Status: DC

## 2017-09-30 MED ORDER — MEDROXYPROGESTERONE ACETATE 10 MG PO TABS
ORAL_TABLET | ORAL | 6 refills | Status: DC
Start: 2017-09-30 — End: 2018-10-25

## 2017-09-30 NOTE — Progress Notes (Signed)
    GYNECOLOGY PROGRESS NOTE  Subjective:    Patient ID: Kristina MilchStephanie Milner, female    DOB: Dec 03, 1991, 26 y.o.   MRN: [id number]  HPI  Patient is a 26 y.o. G0P0000 female who presents for further management of infertility.  Patient with a recent diagnosis of PCOS, with hyperandrogenism, and also elevated prolactin levels.  Notes she was seen by an Endocrinologist, where a brain MRI was performed and was ruled out for pituitary tumors. Notes that they did not recommend treatment at this time, unless patient was not ovulating.  Had previously done approximately 3 rounds of clomid prior to this.   Patient notes that she also desires information in order to have semen analysis performed for partner.   The following portions of the patient's history were reviewed and updated as appropriate: allergies, current medications, past family history, past medical history, past social history, past surgical history and problem list.  Review of Systems Pertinent items noted in HPI and remainder of comprehensive ROS otherwise negative.   Objective:   Blood pressure 125/85, pulse 80, height 5\' 3"  (1.6 m), weight 193 lb 3.2 oz (87.6 kg), last menstrual period 07/21/2017. Body mass index is 34.22 kg/m.  General appearance: alert and no distress Abdomen: soft, non-tender; bowel sounds normal; no masses,  no organomegaly Pelvic: deferred  Assessment:   1. Amenorrhea 2. Infertility  3. PCOS  4. Hyperprolactinemia (mild, idiopathic) 5. Obesity Class I  Plan:   1. Amenorrhea noted since August, UPT today negative.  Discussed need for withdrawal bleed.  Will prescribe Provera. Discussed to take medication every 35 days if no spontaneous cycle ensues.  2. Infertility, possibly secondary to anovulation. Patient notes that she tried to do the ovulation kits, but due to her job and working different shifts, she noted it difficult to do the test at the same time each day. Recommend that she f/u on Day 21 of  her next cycle for a progesterone level instead. Will also resume ovulation induction meds, but will change from Clomid to Femara.  Patient also strongly desiring to have boyfriend tested.  Noted that it is usually not recommended if another cause has already been found and due to his current age (he is also in late 6020s). Patient is adamant that he be tested, stating "I'm not going to through all of this for nothing and it turned out that he played a part as well". Given instructions and labs slip for semen analysis.  3. PCOS with hyperandrogenism, currently on Metformin 500 mg BID. Requiring ovulation induction 4. Hyperprolactinemia, mild.  Idiopathic according to recent Endocrinology visit, not requiring any treatment at this time. If patient continues to be anovulatory despite all current management, can consider treatment at that time. Is to f/u with Endocrinologist in 6 months.  5. Continue to encourage weight management, healthy lifestyle and eating habits, as this could also increase her chances of successful fertility.   Patient to call once refills of Femara have been completed, in order to increase to next dose (usually in 3-4 months). Will f/u in ~ 6 months if no pregnancy ensues prior to this time.   A total of 15 minutes were spent face-to-face with the patient during this encounter and over half of that time dealt with counseling and coordination of care.   Hildred Laserherry, Chequita Mofield, MD [facility]

## 2017-10-01 ENCOUNTER — Encounter: Payer: Self-pay | Admitting: Obstetrics and Gynecology

## 2017-10-25 IMAGING — MR MR HEAD WO/W CM
15 of 19 series · 35 of 48 positions shown · IV contrast (multihance)
Comparison: None.

CLINICAL DATA: Elevated prolactin level.

EXAM:
MRI HEAD WITHOUT AND WITH CONTRAST
TECHNIQUE: Multiplanar, multiecho pulse sequences of the brain and surrounding
structures were obtained without and with intravenous contrast.
CONTRAST:  10mL MULTIHANCE GADOBENATE DIMEGLUMINE 529 MG/ML IV SOLN

[Series 2: T1 · sagittal · 5.0mm · 0.45mm/px · 1 of 21 slices shown]
[im 1/21]
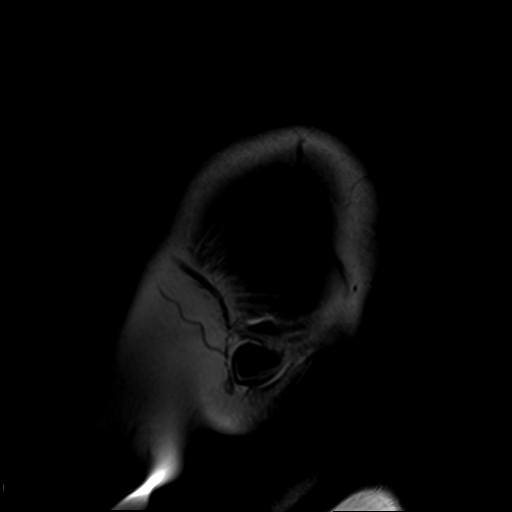

[Series 3: DWI · axial · 3.0mm · 1.80mm/px · z∈[-37,+110]mm · 11 of 99 slices shown]
[im 1/99]
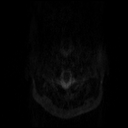
[im 10/99]
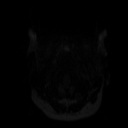
[im 20/99]
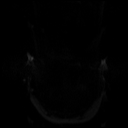
[im 30/99]
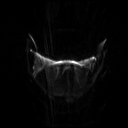
[im 40/99]
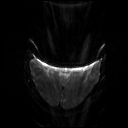
[im 50/99]
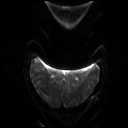
[im 59/99]
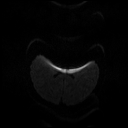
[im 69/99]
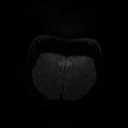
[im 79/99]
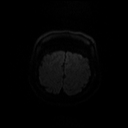
[im 89/99]
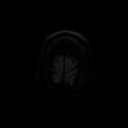
[im 99/99]
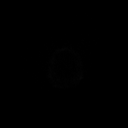

[Series 4: dwi_adc · axial · 3.0mm · 1.80mm/px · z∈[-37,+110]mm · 6 of 50 slices shown]
[im 1/50]
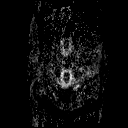
[im 10/50]
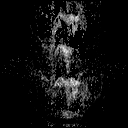
[im 20/50]
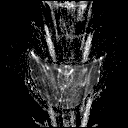
[im 30/50]
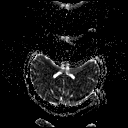
[im 40/50]
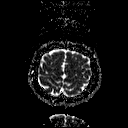
[im 50/50]
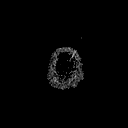

[Series 5: T2 · axial · 5.0mm · 0.36mm/px · z∈[-41,+114]mm · 3 of 25 slices shown]
[im 1/25]
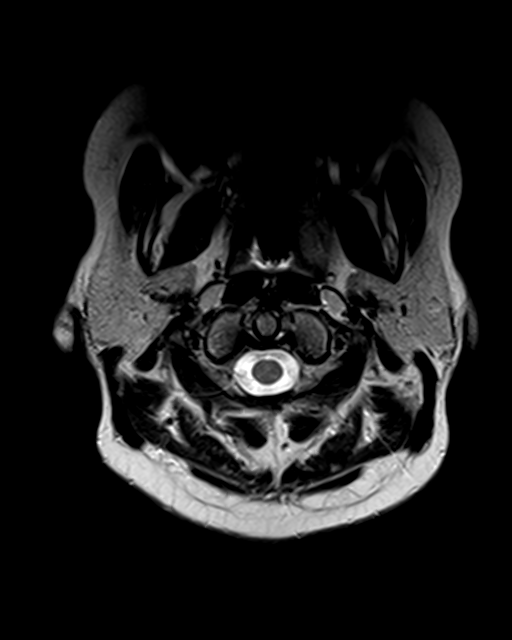
[im 13/25]
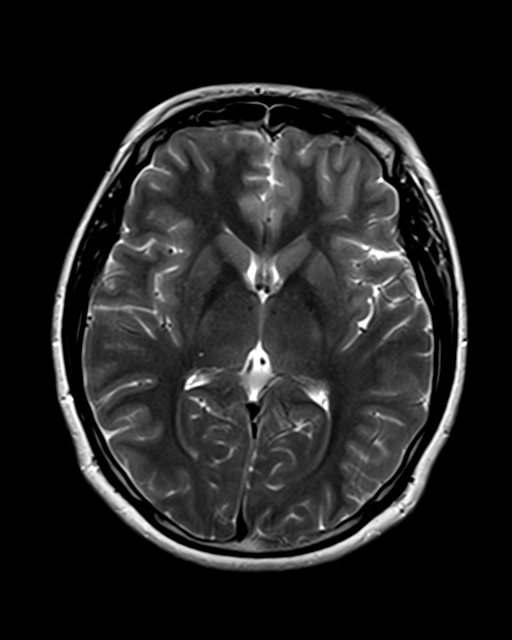
[im 25/25]
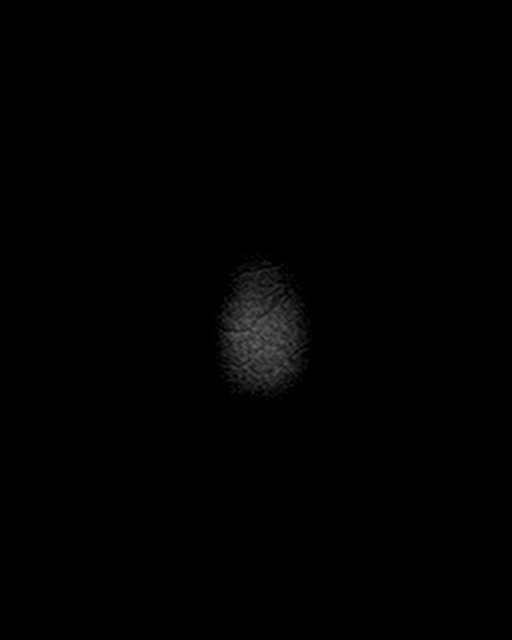

[Series 6: FLAIR · axial · 3.0mm · 0.45mm/px · z∈[-35,+108]mm · 3 of 25 slices shown]
[im 1/25]
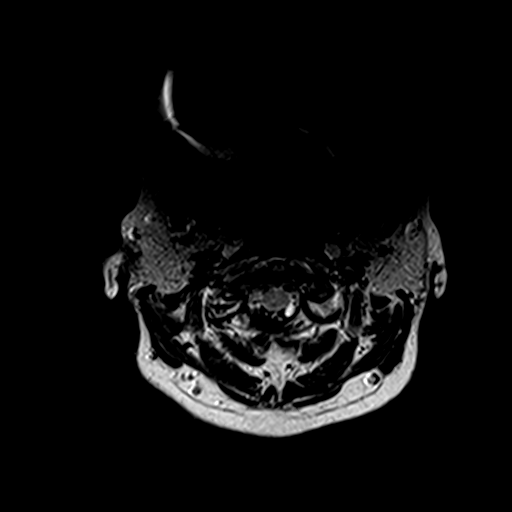
[im 13/25]
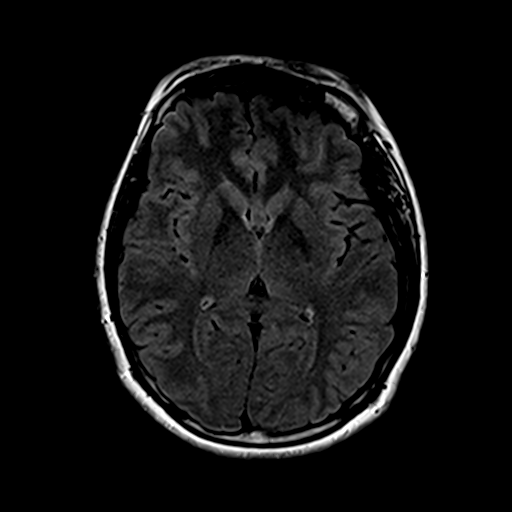
[im 25/25]
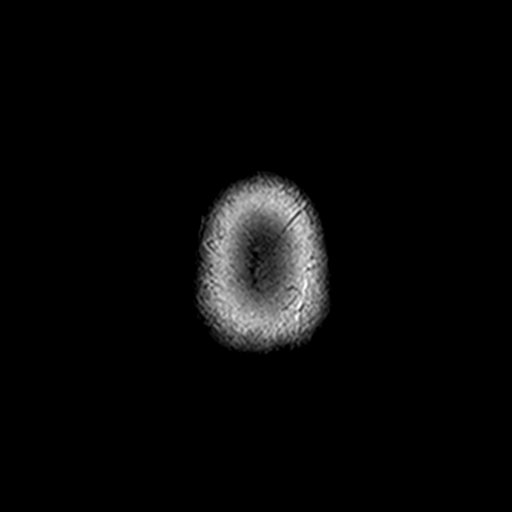

[Series 7: axial grad (blood) · axial · 5.0mm · 0.45mm/px · z∈[-34,+107]mm · 2 of 22 slices shown]
[im 1/22]
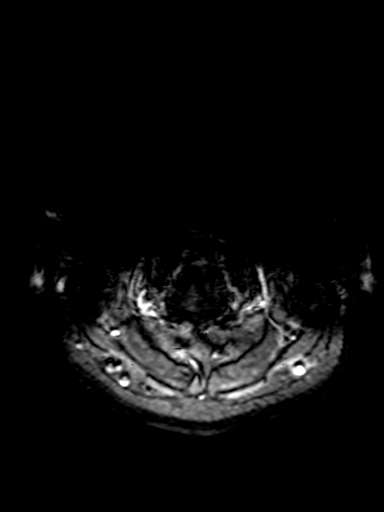
[im 22/22]
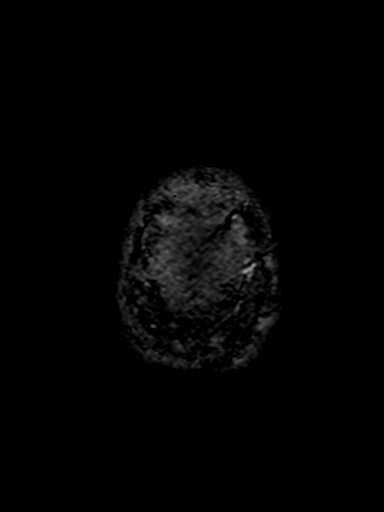

[Series 8: sag 3mm · sagittal · 3.0mm · 0.33mm/px · 1 of 11 slices shown]
[im 1/11]
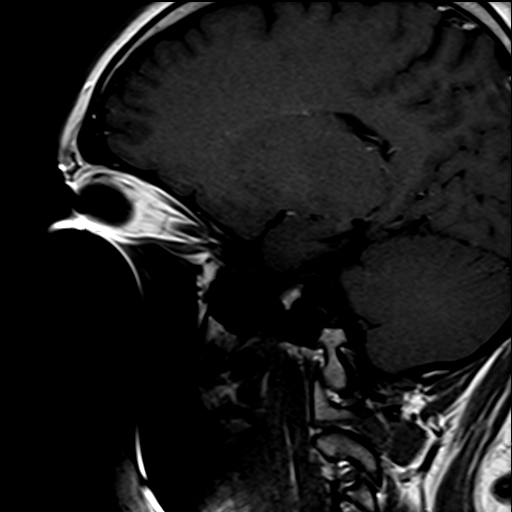

[Series 9: cor 3mm · coronal · 3.0mm · 0.33mm/px · 1 of 11 slices shown]
[im 1/11]
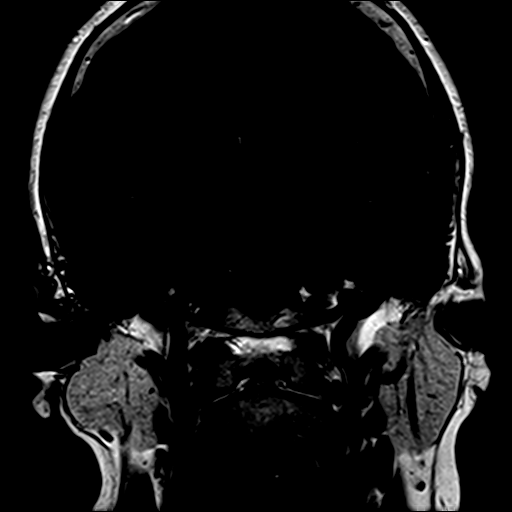

[Series 10: pre cor dynamic · coronal · non-contrast · 3.0mm · 0.35mm/px · 1 of 7 slices shown]
[im 1/7]
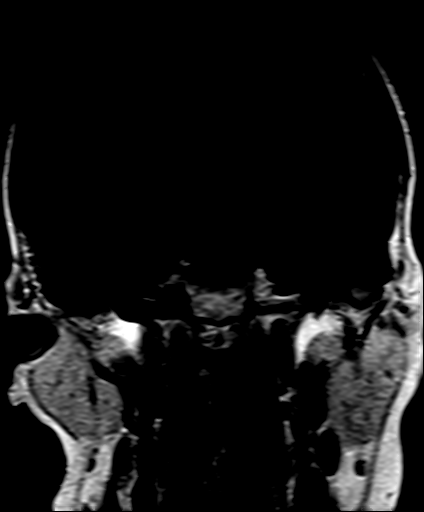

[Series 11: post fs cor · coronal · 3.0mm · 0.35mm/px · 1 of 7 slices shown (1 of 6)]
[im 1/7]
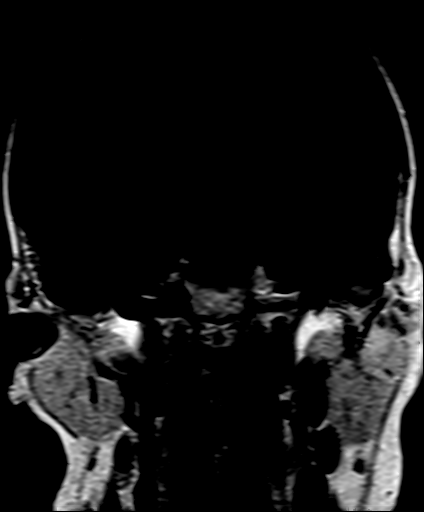

[Series 12: post fs cor · coronal · 3.0mm · 0.35mm/px · 1 of 7 slices shown (2 of 6)]
[im 1/7]
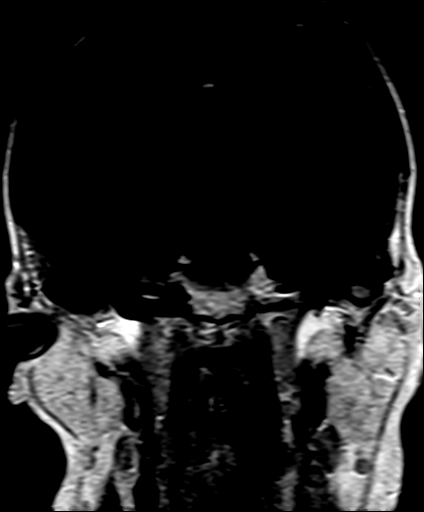

[Series 13: post fs cor · coronal · 3.0mm · 0.35mm/px · 1 of 7 slices shown (3 of 6)]
[im 1/7]
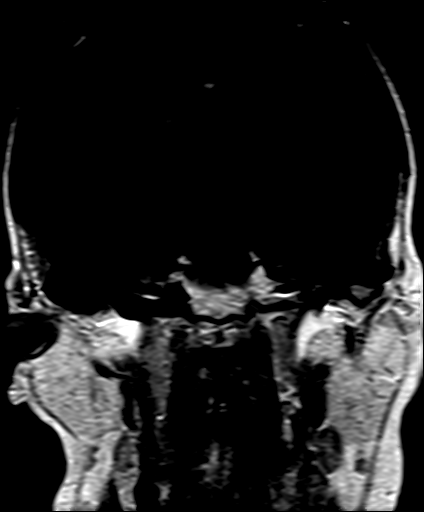

[Series 14: post fs cor · coronal · 3.0mm · 0.35mm/px · 1 of 7 slices shown (4 of 6)]
[im 1/7]
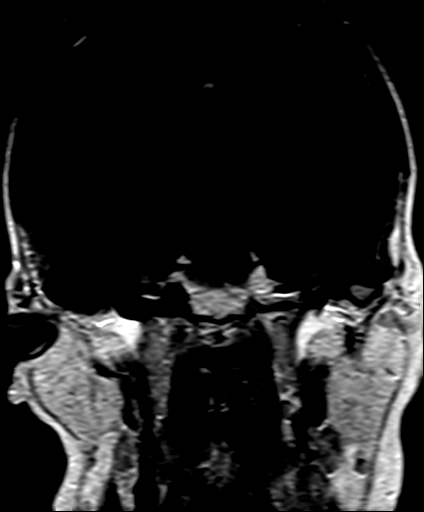

[Series 15: post fs cor · coronal · 3.0mm · 0.35mm/px · 1 of 7 slices shown (5 of 6)]
[im 1/7]
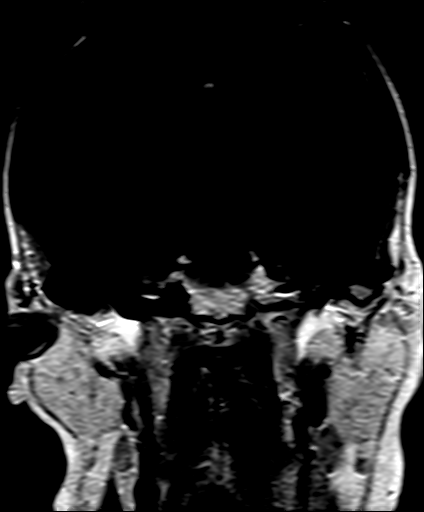

[Series 16: post fs cor · coronal · 3.0mm · 0.35mm/px · 1 of 7 slices shown (6 of 6)]
[im 1/7]
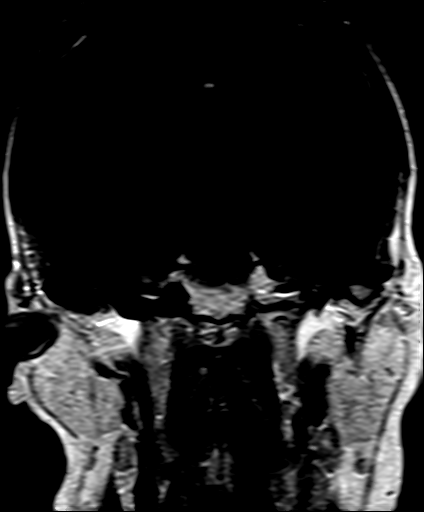

[35 of 48 positions shown; findings below may reference images not displayed]

FINDINGS: Brain: Portions of the exam are distorted by a metallic braces.

No acute infarct, hemorrhage, or mass lesion is present. The
ventricles are of normal size. No significant extraaxial fluid
collection is present.

The pituitary gland is grossly normal in size. Dedicated imaging of
the pituitary demonstrates homogeneous enhancement. No discrete
lesion is present. The cavernous sinus is normal bilaterally.

Postcontrast imaging through the remainder the brain is
unremarkable.

Vascular: Flow is present in the major intracranial arteries.

Skull and upper cervical spine: The skullbase is somewhat obscured
by artifact. No discrete lesions are present. The craniocervical
junction is normal. Midline sagittal structures are otherwise
unremarkable.

Sinuses/Orbits: Left greater than right sphenoid sinus opacification
is present. There is scattered mucosal disease throughout the
ethmoid air cells extending into the frontal sinuses. The maxillary
sinuses are obscured by artifact. The globes and orbits are markedly
distorted by artifact.
IMPRESSION: 1. No discrete pituitary lesion identified.
2. Normal MRI appearance of the brain.
3. Moderate diffuse sinus disease.

## 2017-11-05 ENCOUNTER — Other Ambulatory Visit: Payer: 59

## 2017-11-05 NOTE — Addendum Note (Signed)
Addended by: Marchelle FolksMILLER, Filicia Scogin G on: 11/05/2017 01:50 PM   Modules accepted: Orders

## 2017-11-06 LAB — PROGESTERONE: Progesterone: 4.8 ng/mL

## 2017-12-03 NOTE — L&D Delivery Note (Signed)
Delivery Note At 8:34 AM a viable female was delivered as pt was pushing and FHR decreased from 140 to 80 to 30 while head on the perineum so enc to push with del of vtx, Ant and post shoulder & body del at 0834 via Vaginal, Spontaneous (Presentation:OA position).  APGAR: , ; weight:6#2  .   Placenta status: del spontaneously with 1 area noted to be aburuption approx 25% of placenta, no missing cotylydones, .Will send placenta  Cord:  with the following complications: Cental insertion, 3 VC noted, Cord blood sent. 1st degre of perineal repaired with continuous x 2. Lt periurethral area repaired x 2 sutures interupted. FF and bleeding controlled. Anesthesia: Epidural with repair Episiotomy: None Lacerations: 1st degree;Perineal Suture Repair: 3-0 CH on CT Est. Blood Loss (mL): 200 Needle ct x 1 and correct. Sponge ct x 10 and correct. Mom to postpartum.  Baby to mom's abd for skin to skin bonding Name of baby: Roman ______________________________________________ 10/24/2018, 8:57 AM Myrtie Cruisearon W. ,RN, MSN, CNM, FNP Certified Nurse Midwife [facility]

## 2018-01-06 ENCOUNTER — Encounter: Payer: Self-pay | Admitting: Obstetrics and Gynecology

## 2018-01-06 MED ORDER — LETROZOLE 2.5 MG PO TABS: 5.0000 mg | ORAL_TABLET | Freq: Every day | ORAL | 1 refills | Status: DC

## 2018-01-07 ENCOUNTER — Other Ambulatory Visit: Payer: Self-pay

## 2018-01-07 ENCOUNTER — Other Ambulatory Visit: Payer: Self-pay | Admitting: Obstetrics and Gynecology

## 2018-01-07 DIAGNOSIS — E282 Polycystic ovarian syndrome: Secondary | ICD-10-CM

## 2018-01-07 MED ORDER — METFORMIN HCL 500 MG PO TABS: ORAL_TABLET | ORAL | 2 refills | Status: DC

## 2018-02-05 ENCOUNTER — Other Ambulatory Visit: Payer: Self-pay

## 2018-02-24 ENCOUNTER — Ambulatory Visit: Payer: 59 | Admitting: Obstetrics and Gynecology

## 2018-02-24 ENCOUNTER — Encounter: Payer: Self-pay | Admitting: Obstetrics and Gynecology

## 2018-02-24 VITALS — BP 126/78 | HR 94 | Ht 64.0 in | Wt 197.1 lb

## 2018-02-24 DIAGNOSIS — Z8742 Personal history of other diseases of the female genital tract: Secondary | ICD-10-CM | POA: Diagnosis not present

## 2018-02-24 DIAGNOSIS — Z3201 Encounter for pregnancy test, result positive: Secondary | ICD-10-CM

## 2018-02-24 DIAGNOSIS — E282 Polycystic ovarian syndrome: Secondary | ICD-10-CM | POA: Diagnosis not present

## 2018-02-24 LAB — POCT URINE PREGNANCY: PREG TEST UR: POSITIVE — AB

## 2018-02-24 MED ORDER — CITRANATAL ASSURE 35-1 & 300 MG PO MISC: 1.0000 | Freq: Every day | ORAL | 6 refills | Status: DC

## 2018-02-24 NOTE — Progress Notes (Signed)
   GYNECOLOGY CLINIC PROGRESS NOTE Subjective:    Kristina Rubio is a 27 y.o. G0P0 female who presents for evaluation of amenorrhea. She believes she could be pregnant. Pregnancy is desired. Patient has been undergoing fertility management with Femara for ~ 6 months due to h/o PCOS. Sexual Activity: single partner, contraception: none. Current symptoms also include: breast tenderness and positive home pregnancy test. Last period was normal. Patient's last menstrual period was 01/25/2018.     The following portions of the patient's history were reviewed and updated as appropriate: allergies, current medications, past family history, past medical history, past social history, past surgical history and problem list.  Review of Systems A comprehensive review of systems was negative except for: Genitourinary: positive for vaginal itching x 1 day (more external). Notes clear vaginal discharge. Denies dysuria or hematuria. Does report some occasional pelvic cramping, mild.       Objective:    BP 126/78   Pulse 94   Ht 5\' 4"  (1.626 m)   Wt 197 lb 1.6 oz (89.4 kg)   LMP 01/25/2018   BMI 33.83 kg/m  General: alert, no distress and no acute distress   Abdomen:  Soft, non-tender, non-distended, no masses  Pelvis:  external genitalia normal, rectovaginal septum normal.  Vagina with scant thin white discharge, no odor.  Cervix normal appearing, no lesions and no motion tenderness.  Uterus mobile, nontender, normal shape and size.  Adnexae non-palpable, nontender bilaterally.     Lab Review Urine HCG: positive  Microscopic wet-mount exam shows: few clue cells and normal epithelial cells present. No trichomonads, budding hyphae or yeast. No WBCs.    Assessment:   Absence of menstruation, Pregnancy test positive PCOS (polycystic ovarian syndrome)  History of infertility, female  Obesity Vaginal itching  Plan:   - Pregnancy Test: Positive: EDC: 11/01/2018 at 4.[redacted] weeks gestation. Briefly  discussed pre-natal care options. Pregnancy, Childbirth and the Newborn book given. Encouraged well-balanced diet, plenty of rest when needed, pre-natal vitamins daily and walking for exercise. Discussed self-help for nausea, avoiding OTC medications until consulting provider or pharmacist, other than Tylenol as needed, minimal caffeine (1-2 cups daily) and avoiding alcohol. She will schedule her initial OB visit in the 2 months with her OB provider, and will have a nurse intake visit in 1 month. Feel free to call with any questions.  - Patient with PCOS, was taking Metformin to help with ovulation induction medications.  Can discontinue at this time. She will need early glucola testing in pregnancy due to risk factors (PCOS, obesity).  - Vaginal itching x 1 day. Few clue cells present, not enough for diagnosis of BV. Advised on OTC remedies (Vagisil, coconut oil/T-tree oil for itching, consuming more yogurt).  If no improvement in 1 week, can send in prescription for Flagyl. She also is taking an antibiotic for sinus infection (1st generation cephalosporin) for 21 days (is on Day 14).  May also need treatment for potential yeast infection due to antibiotic use.    Hildred Laserherry, Manjot Beumer, MD [facility]

## 2018-02-24 NOTE — Progress Notes (Signed)
Pt has a positive pregnancy test. Pt stated that she may have a uti or yeast infection.

## 2018-02-28 ENCOUNTER — Ambulatory Visit (INDEPENDENT_AMBULATORY_CARE_PROVIDER_SITE_OTHER): Payer: 59 | Admitting: Obstetrics and Gynecology

## 2018-02-28 ENCOUNTER — Other Ambulatory Visit: Payer: Self-pay

## 2018-02-28 ENCOUNTER — Other Ambulatory Visit: Payer: Self-pay | Admitting: Obstetrics and Gynecology

## 2018-02-28 ENCOUNTER — Encounter: Payer: Self-pay | Admitting: Obstetrics and Gynecology

## 2018-02-28 VITALS — BP 125/77 | HR 90 | Wt 197.1 lb

## 2018-02-28 DIAGNOSIS — O200 Threatened abortion: Secondary | ICD-10-CM | POA: Diagnosis not present

## 2018-02-28 DIAGNOSIS — Z8742 Personal history of other diseases of the female genital tract: Secondary | ICD-10-CM | POA: Diagnosis not present

## 2018-02-28 LAB — POCT URINALYSIS DIPSTICK
Bilirubin, UA: NEGATIVE
GLUCOSE UA: NEGATIVE
Ketones, UA: NEGATIVE
LEUKOCYTES UA: NEGATIVE
Nitrite, UA: NEGATIVE
Protein, UA: NEGATIVE
SPEC GRAV UA: 1.02 (ref 1.010–1.025)
UROBILINOGEN UA: 0.2 U/dL
pH, UA: 6 (ref 5.0–8.0)

## 2018-02-28 NOTE — Progress Notes (Signed)
    GYNECOLOGY PROGRESS NOTE  Subjective:    Patient ID: Kristina Rubio, female    DOB: 1991/11/09, 27 y.o.   MRN: [id number]  HPI  Patient is a 27 y.o. 701P0000 female who presents for complaints of vaginal spotting when wiping today x 1 episode. She is currently 4.[redacted] weeks pregnant, with EDD 11/01/2018.  Denies pelvic cramping, dysuria, hematuria.   The following portions of the patient's history were reviewed and updated as appropriate: allergies, current medications, past family history, past medical history, past social history, past surgical history and problem list.  Review of Systems Pertinent items noted in HPI and remainder of comprehensive ROS otherwise negative.   Objective:   Blood pressure 125/77, pulse 90, weight 197 lb 1.6 oz (89.4 kg), last menstrual period 01/25/2018. General appearance: alert and no distress Abdomen: soft, non-tender; bowel sounds normal; no masses,  no organomegaly Pelvic: external genitalia normal, rectovaginal septum normal.  Vagina with small amount of brownish-red watery dishcarge..  Cervix normal appearing, no lesions and no motion tenderness, os closed.  Uterus mobile, nontender, normal shape and size.  Adnexae non-palpable, nontender bilaterally.    Labs:  Results for orders placed or performed in visit on 02/28/18  POCT urinalysis dipstick  Result Value Ref Range   Color, UA yellow    Clarity, UA clear    Glucose, UA neg    Bilirubin, UA neg    Ketones, UA neg    Spec Grav, UA 1.020 1.010 - 1.025   Blood, UA 3+    pH, UA 6.0 5.0 - 8.0   Protein, UA neg    Urobilinogen, UA 0.2 0.2 or 1.0 E.U./dL   Nitrite, UA neg    Leukocytes, UA Negative Negative   Appearance yellow    Odor       Assessment:   H/o infertility, currently pregnant Threatened miscarriage  Plan:   Threatened miscarriage - small amount of brownish-red blood in vaginal vault. Cervix closed. Discussed threatened Ab, will order serial BHCG and Type and screen.  Given bleeding precautions.  Advised on pelvic rest and no strenuous activity for the next 1-2 weeks.  Patient to return for next blood draw.  UA today only with blood, no pathogens, likely contaminated from vaginal bleeding.    Hildred Laserherry, Dequante Tremaine, MD [facility]

## 2018-02-28 NOTE — Patient Instructions (Signed)
Threatened Miscarriage °A threatened miscarriage occurs when you have vaginal bleeding during your first 20 weeks of pregnancy but the pregnancy has not ended. If you have vaginal bleeding during this time, your health care provider will do tests to make sure you are still pregnant. If the tests show you are still pregnant and the developing baby (fetus) inside your womb (uterus) is still growing, your condition is considered a threatened miscarriage. °A threatened miscarriage does not mean your pregnancy will end, but it does increase the risk of losing your pregnancy (complete miscarriage). °What are the causes? °The cause of a threatened miscarriage is usually not known. If you go on to have a complete miscarriage, the most common cause is an abnormal number of chromosomes in the developing baby. Chromosomes are the structures inside cells that hold all your genetic material. °Some causes of vaginal bleeding that do not result in miscarriage include: °· Having sex. °· Having an infection. °· Normal hormone changes of pregnancy. °· Bleeding that occurs when an egg implants in your uterus. ° °What increases the risk? °Risk factors for bleeding in early pregnancy include: °· Obesity. °· Smoking. °· Drinking excessive amounts of alcohol or caffeine. °· Recreational drug use. ° °What are the signs or symptoms? °· Light vaginal bleeding. °· Mild abdominal pain or cramps. °How is this diagnosed? °If you have bleeding with or without abdominal pain before 20 weeks of pregnancy, your health care provider will do tests to check whether you are still pregnant. One important test involves using sound waves and a computer (ultrasound) to create images of the inside of your uterus. Other tests include an internal exam of your vagina and uterus (pelvic exam) and measurement of your baby’s heart rate. °You may be diagnosed with a threatened miscarriage if: °· Ultrasound testing shows you are still pregnant. °· Your baby’s heart  rate is strong. °· A pelvic exam shows that the opening between your uterus and your vagina (cervix) is closed. °· Your heart rate and blood pressure are stable. °· Blood tests confirm you are still pregnant. ° °How is this treated? °No treatments have been shown to prevent a threatened miscarriage from going on to a complete miscarriage. However, the right home care is important. °Follow these instructions at home: °· Make sure you keep all your appointments for prenatal care. This is very important. °· Get plenty of rest. °· Do not have sex or use tampons if you have vaginal bleeding. °· Do not douche. °· Do not smoke or use recreational drugs. °· Do not drink alcohol. °· Avoid caffeine. °Contact a health care provider if: °· You have light vaginal bleeding or spotting while pregnant. °· You have abdominal pain or cramping. °· You have a fever. °Get help right away if: °· You have heavy vaginal bleeding. °· You have blood clots coming from your vagina. °· You have severe low back pain or abdominal cramps. °· You have fever, chills, and severe abdominal pain. °This information is not intended to replace advice given to you by your health care provider. Make sure you discuss any questions you have with your health care provider. °Document Released: 11/19/2005 Document Revised: 04/26/2016 Document Reviewed: 09/15/2013 °Elsevier Interactive Patient Education © 2018 [employer] ° °

## 2018-02-28 NOTE — Progress Notes (Signed)
ROB-pt called the office this morning due to noticing blood when she wiped this morning.

## 2018-03-01 LAB — ABO AND RH: Rh Factor: POSITIVE

## 2018-03-01 LAB — ANTIBODY SCREEN: Antibody Screen: NEGATIVE

## 2018-03-03 ENCOUNTER — Encounter (INDEPENDENT_AMBULATORY_CARE_PROVIDER_SITE_OTHER): Payer: Self-pay

## 2018-03-03 ENCOUNTER — Other Ambulatory Visit: Payer: 59

## 2018-03-03 DIAGNOSIS — O200 Threatened abortion: Secondary | ICD-10-CM

## 2018-03-04 ENCOUNTER — Encounter: Payer: 59 | Admitting: Obstetrics and Gynecology

## 2018-03-04 LAB — BETA HCG QUANT (REF LAB)
hCG Quant: 23246 m[IU]/mL
hCG Quant: 9347 m[IU]/mL

## 2018-03-06 ENCOUNTER — Encounter: Payer: Self-pay | Admitting: Obstetrics and Gynecology

## 2018-03-25 ENCOUNTER — Other Ambulatory Visit: Payer: 59

## 2018-04-18 LAB — OB RESULTS CONSOLE VARICELLA ZOSTER ANTIBODY, IGG: Varicella: NON-IMMUNE/NOT IMMUNE

## 2018-04-18 LAB — OB RESULTS CONSOLE HEPATITIS B SURFACE ANTIGEN: Hepatitis B Surface Ag: NEGATIVE

## 2018-04-18 LAB — OB RESULTS CONSOLE HIV ANTIBODY (ROUTINE TESTING): HIV: NONREACTIVE

## 2018-04-18 LAB — OB RESULTS CONSOLE RUBELLA ANTIBODY, IGM: Rubella: IMMUNE

## 2018-04-18 LAB — OB RESULTS CONSOLE RPR: RPR: NONREACTIVE

## 2018-06-05 ENCOUNTER — Other Ambulatory Visit: Payer: Self-pay

## 2018-06-05 ENCOUNTER — Emergency Department: Payer: 59

## 2018-06-05 ENCOUNTER — Emergency Department
Admission: EM | Admit: 2018-06-05 | Discharge: 2018-06-05 | Disposition: A | Discharge status: Home / Self Care | Payer: 59 | Attending: Emergency Medicine | Admitting: Emergency Medicine

## 2018-06-05 DIAGNOSIS — Z3A18 18 weeks gestation of pregnancy: Secondary | ICD-10-CM | POA: Insufficient documentation

## 2018-06-05 DIAGNOSIS — O9989 Other specified diseases and conditions complicating pregnancy, childbirth and the puerperium: Secondary | ICD-10-CM | POA: Diagnosis not present

## 2018-06-05 DIAGNOSIS — K529 Noninfective gastroenteritis and colitis, unspecified: Secondary | ICD-10-CM | POA: Diagnosis not present

## 2018-06-05 DIAGNOSIS — Z79899 Other long term (current) drug therapy: Secondary | ICD-10-CM | POA: Diagnosis not present

## 2018-06-05 DIAGNOSIS — R1011 Right upper quadrant pain: Secondary | ICD-10-CM

## 2018-06-05 LAB — LIPASE, BLOOD: Lipase: 27 U/L (ref 11–51)

## 2018-06-05 LAB — URINALYSIS, COMPLETE (UACMP) WITH MICROSCOPIC
BACTERIA UA: NONE SEEN
BILIRUBIN URINE: NEGATIVE
Glucose, UA: NEGATIVE mg/dL
HGB URINE DIPSTICK: NEGATIVE
Ketones, ur: NEGATIVE mg/dL
LEUKOCYTES UA: NEGATIVE
NITRITE: NEGATIVE
PROTEIN: NEGATIVE mg/dL
Specific Gravity, Urine: 1.013 (ref 1.005–1.030)
pH: 6 (ref 5.0–8.0)

## 2018-06-05 LAB — COMPREHENSIVE METABOLIC PANEL
ALBUMIN: 3.4 g/dL — AB (ref 3.5–5.0)
ALT: 16 U/L (ref 0–44)
AST: 21 U/L (ref 15–41)
Alkaline Phosphatase: 68 U/L (ref 38–126)
Anion gap: 6 (ref 5–15)
BILIRUBIN TOTAL: 0.5 mg/dL (ref 0.3–1.2)
BUN: 6 mg/dL (ref 6–20)
CHLORIDE: 106 mmol/L (ref 98–111)
CO2: 24 mmol/L (ref 22–32)
Calcium: 9.2 mg/dL (ref 8.9–10.3)
Creatinine, Ser: 0.61 mg/dL (ref 0.44–1.00)
GFR calc Af Amer: >60 mL/min (ref >60)
GFR calc non Af Amer: >60 mL/min (ref >60)
Glucose, Bld: 110 mg/dL — ABNORMAL HIGH (ref 70–99)
POTASSIUM: 3.7 mmol/L (ref 3.5–5.1)
SODIUM: 136 mmol/L (ref 135–145)
Total Protein: 7.5 g/dL (ref 6.5–8.1)

## 2018-06-05 LAB — CBC
HEMATOCRIT: 34.7 % — AB (ref 35.0–47.0)
Hemoglobin: 12.1 g/dL (ref 12.0–16.0)
MCH: 30.6 pg (ref 26.0–34.0)
MCHC: 34.8 g/dL (ref 32.0–36.0)
MCV: 88 fL (ref 80.0–100.0)
PLATELETS: 326 10*3/uL (ref 150–440)
RBC: 3.94 MIL/uL (ref 3.80–5.20)
RDW: 14.8 % — AB (ref 11.5–14.5)
WBC: 11.6 10*3/uL — AB (ref 3.6–11.0)

## 2018-06-05 LAB — TROPONIN I: Troponin I: <0.03 ng/mL (ref <0.03)

## 2018-06-05 MED ORDER — LOPERAMIDE HCL 2 MG PO CAPS
4.0000 mg | ORAL_CAPSULE | Freq: Once | ORAL | Status: AC
  Administered 2018-06-05: 4 mg via ORAL
  Filled 2018-06-05: qty 2

## 2018-06-05 MED ORDER — SODIUM CHLORIDE 0.9 % IV BOLUS
1000.0000 mL | Freq: Once | INTRAVENOUS | Status: AC
  Administered 2018-06-05: 1000 mL via INTRAVENOUS

## 2018-06-05 NOTE — ED Notes (Signed)
Pt returned from US

## 2018-06-05 NOTE — ED Notes (Signed)
Patient transported to Ultrasound 

## 2018-06-05 NOTE — ED Provider Notes (Addendum)
[facility] Emergency Department Provider Note    First MD Initiated Contact with Patient 06/05/18 [phone]     (approximate)  I have reviewed the triage vital signs and the nursing notes.   HISTORY  Chief Complaint Abdominal Pain    HPI Kristina Rubio is a 27 y.o. female G1, P0 [redacted] weeks pregnant presents to the emergency department with acute onset of diarrhea times multiple episodes today.  Patient denies any abdominal pain no vaginal bleeding.  Patient denies any vomiting.  Patient denies any fever afebrile on presentation.  Past Medical History:  Diagnosis Date  . PCOS (polycystic ovarian syndrome)     Patient Active Problem List   Diagnosis Date Noted  . 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 Obesity (BMI 30.0-34.9) 11/02/2015  . PCOS (polycystic ovarian syndrome) 11/02/2015    No past surgical history on file.  Prior to Admission medications   Medication Sig Start Date End Date Taking? Authorizing Provider  BIOTIN PO Take by mouth.    [provider]  cefdinir (OMNICEF) 300 MG capsule Take 300 mg by mouth 2 (two) times daily.    [provider]  letrozole (FEMARA) 2.5 MG tablet Take 2 tablets (5 mg total) by mouth daily. Take for 5 days, beginning on Day 3 of menstrual cycle. Patient not taking: Reported on 02/28/2018 01/06/18   Hildred Laser, MD  medroxyPROGESTERone (PROVERA) 10 MG tablet TAKE 1 TABLET (10 MG TOTAL) BY MOUTH DAILY. USE FOR SEVEN DAYS Patient not taking: Reported on 02/24/2018 09/30/17   Hildred Laser, MD  metFORMIN (GLUCOPHAGE) 500 MG tablet TAKE 1 TABLET BY MOUTH TWICE A DAY Patient not taking: Reported on 02/24/2018 01/07/18   Hildred Laser, MD  Prenat w/o A-FeCbGl-DSS-FA-DHA (CITRANATAL ASSURE) 35-1 & 300 MG tablet Take 1 tablet by mouth daily. 02/24/18   Hildred Laser, MD    Allergies No known drug allergies  Family History  Problem Relation Age of Onset  . Anemia Mother     Social  History Social History   Tobacco Use  . Smoking status: Never Smoker  . Smokeless tobacco: Never Used  Substance Use Topics  . Alcohol use: Yes    Comment: occassional  . Drug use: No    Review of Systems Constitutional: No fever/chills Eyes: No visual changes. ENT: No sore throat. Cardiovascular: Denies chest pain. Respiratory: Denies shortness of breath. Gastrointestinal: No abdominal pain.  No nausea, no vomiting.  Positive for diarrhea no constipation. Genitourinary: Negative for dysuria. Musculoskeletal: Negative for neck pain.  Negative for back pain. Integumentary: Negative for rash. Neurological: Negative for headaches, focal weakness or numbness.  ____________________________________________   PHYSICAL EXAM:  VITAL SIGNS: ED Triage Vitals  Enc Vitals Group     BP 06/05/18 0040 127/74     Pulse Rate 06/05/18 0040 (!) 128     Resp 06/05/18 0040 20     Temp 06/05/18 0040 99 F (37.2 C)     Temp Source 06/05/18 0040 Oral     SpO2 06/05/18 0040 99 %     Weight 06/05/18 0041 89.4 kg (197 lb)     Height 06/05/18 0041 1.6 m (5\' 3" )     Head Circumference --      Peak Flow --      Pain Score 06/05/18 0040 5     Pain Loc --      Pain Edu? --      Excl. in GC? --     Constitutional: Alert and  oriented. Well appearing and in no acute distress. Eyes: Conjunctivae are normal.  Head: Atraumatic. Mouth/Throat: Mucous membranes are moist. Oropharynx non-erythematous. Neck: No stridor.   Cardiovascular: Normal rate, regular rhythm. Good peripheral circulation. Grossly normal heart sounds. Respiratory: Normal respiratory effort.  No retractions. Lungs CTAB. Gastrointestinal: Right upper quadrant/left upper quadrant tenderness to palpation.  No distention.  Musculoskeletal: No lower extremity tenderness nor edema. No gross deformities of extremities. Neurologic:  Normal speech and language. No gross focal neurologic deficits are appreciated.  Skin:  Skin is warm, dry  and intact. No rash noted. Psychiatric: Mood and affect are normal. Speech and behavior are normal.  ____________________________________________   LABS (all labs ordered are listed, but only abnormal results are displayed)  Labs Reviewed  CBC - Abnormal; Notable for the following components:      Result Value   WBC 11.6 (*)    HCT 34.7 (*)    RDW 14.8 (*)    All other components within normal limits  COMPREHENSIVE METABOLIC PANEL - Abnormal; Notable for the following components:   Glucose, Bld 110 (*)    Albumin 3.4 (*)    All other components within normal limits  URINALYSIS, COMPLETE (UACMP) WITH MICROSCOPIC - Abnormal; Notable for the following components:   Color, Urine YELLOW (*)    APPearance CLEAR (*)    All other components within normal limits  LIPASE, BLOOD  TROPONIN I    RADIOLOGY I, [place] N Donterrius Santucci, personally viewed and evaluated these images (plain radiographs) as part of my medical decision making, as well as reviewing the written report by the radiologist.  ED MD interpretation: Negative right upper quadrant ultrasound.  Official radiology report(s): Koreas Abdomen Limited Ruq  Result Date: 06/05/2018 CLINICAL DATA:  Right upper quadrant pain radiating to the back since yesterday morning. Eighteen weeks pregnant. EXAM: ULTRASOUND ABDOMEN LIMITED RIGHT UPPER QUADRANT COMPARISON:  None. FINDINGS: Gallbladder: No gallstones or wall thickening visualized. No sonographic Murphy sign noted by sonographer. Common bile duct: Diameter: 1.8 mm, normal Liver: No focal lesion identified. Within normal limits in parenchymal echogenicity. Portal vein is patent on color Doppler imaging with normal direction of blood flow towards the liver. Images obtained incidentally of the right kidney demonstrate no hydronephrosis. IMPRESSION: Normal examination. Electronically Signed   By: Burman NievesWilliam  Stevens M.D.   On: 06/05/2018 05:19   ED ECG REPORT I, Jolly N Akosua Constantine, the attending  physician, personally viewed and interpreted this ECG.   Date: 06/05/2018  EKG Time:   Rate: 123  Rhythm: Sinus tachycardia  Axis: Normal  Intervals: Normal  ST&T Change: None    Procedures   ____________________________________________   INITIAL IMPRESSION / ASSESSMENT AND PLAN / ED COURSE  As part of my medical decision making, I reviewed the following data within the electronic MEDICAL RECORD NUMBER   27 year old female presented with above-stated history and physical exam secondary to diarrhea.  Patient noted to be tachycardic on arrival heart rate of 128.  History consistent with possible gastroenteritis versus gallbladder disease.  Right upper quadrant ultrasound revealed no acute abnormality.  Patient given 2 L IV normal saline the emergency department with improvement of heart rate.  Laboratory data unremarkable including urinalysis.  I suspect viral gastroenteritis as etiology for the patient's symptoms.  Spoke with patient at length regarding management at home and warning signs.    ____________________________________________  FINAL CLINICAL IMPRESSION(S) / ED DIAGNOSES  Final diagnoses:  RUQ pain  Gastroenteritis     MEDICATIONS GIVEN DURING THIS VISIT:  Medications  sodium chloride 0.9 % bolus 1,000 mL (0 mLs Intravenous Stopped 06/05/18 0442)  loperamide (IMODIUM) capsule 4 mg (4 mg Oral Given 06/05/18 0535)     ED Discharge Orders    None       Note:  This document was prepared using Dragon voice recognition software and may include unintentional dictation errors.    Darci Current, MD 06/05/18 3244    Darci Current, MD 06/26/18 [phone]

## 2018-06-05 NOTE — ED Triage Notes (Signed)
Pt in with co right rib pain that started to right mid back states started this am. Also to left rib area, no abd pain, has had multiple episodes of diarrhea no vomiting. Pt is [redacted] weeks pregnant, denies any vag bleeding or dysuria.

## 2018-06-05 NOTE — ED Notes (Signed)
Pt assisted to the bathroom.  Voidx1

## 2018-06-05 NOTE — ED Notes (Signed)
FHTs 160 and regular, via doppler noted to left lower abd

## 2018-10-08 ENCOUNTER — Inpatient Hospital Stay
Admission: EM | Admit: 2018-10-08 | Discharge: 2018-10-08 | Disposition: A | Discharge status: Home / Self Care | Payer: 59 | Attending: Obstetrics and Gynecology | Admitting: Obstetrics and Gynecology

## 2018-10-08 ENCOUNTER — Encounter: Payer: Self-pay | Admitting: Certified Nurse Midwife

## 2018-10-08 DIAGNOSIS — Z3A36 36 weeks gestation of pregnancy: Secondary | ICD-10-CM | POA: Diagnosis not present

## 2018-10-08 DIAGNOSIS — O163 Unspecified maternal hypertension, third trimester: Secondary | ICD-10-CM | POA: Insufficient documentation

## 2018-10-08 DIAGNOSIS — O139 Gestational [pregnancy-induced] hypertension without significant proteinuria, unspecified trimester: Secondary | ICD-10-CM

## 2018-10-08 LAB — CBC
HEMATOCRIT: 35.7 % — AB (ref 36.0–46.0)
Hemoglobin: 11.8 g/dL — ABNORMAL LOW (ref 12.0–15.0)
MCH: 29.9 pg (ref 26.0–34.0)
MCHC: 33.1 g/dL (ref 30.0–36.0)
MCV: 90.6 fL (ref 80.0–100.0)
Platelets: 296 10*3/uL (ref 150–400)
RBC: 3.94 MIL/uL (ref 3.87–5.11)
RDW: 14.2 % (ref 11.5–15.5)
WBC: 10 10*3/uL (ref 4.0–10.5)
nRBC: 0 % (ref 0.0–0.2)

## 2018-10-08 LAB — OB RESULTS CONSOLE GBS: STREP GROUP B AG: NEGATIVE

## 2018-10-08 LAB — OB RESULTS CONSOLE GC/CHLAMYDIA
CHLAMYDIA, DNA PROBE: NEGATIVE
Gonorrhea: NEGATIVE

## 2018-10-08 LAB — COMPREHENSIVE METABOLIC PANEL
ALBUMIN: 3.1 g/dL — AB (ref 3.5–5.0)
ALT: 20 U/L (ref 0–44)
ANION GAP: 7 (ref 5–15)
AST: 21 U/L (ref 15–41)
Alkaline Phosphatase: 133 U/L — ABNORMAL HIGH (ref 38–126)
BILIRUBIN TOTAL: 0.3 mg/dL (ref 0.3–1.2)
BUN: 7 mg/dL (ref 6–20)
CHLORIDE: 109 mmol/L (ref 98–111)
CO2: 23 mmol/L (ref 22–32)
Calcium: 9.2 mg/dL (ref 8.9–10.3)
Creatinine, Ser: 0.51 mg/dL (ref 0.44–1.00)
GFR calc Af Amer: >60 mL/min (ref >60)
GLUCOSE: 79 mg/dL (ref 70–99)
POTASSIUM: 3.7 mmol/L (ref 3.5–5.1)
Sodium: 139 mmol/L (ref 135–145)
TOTAL PROTEIN: 6.9 g/dL (ref 6.5–8.1)

## 2018-10-08 LAB — PROTEIN / CREATININE RATIO, URINE
CREATININE, URINE: 33 mg/dL
Total Protein, Urine: <6 mg/dL

## 2018-10-08 MED ORDER — LABETALOL HCL 5 MG/ML IV SOLN: 20.0000 mg | INTRAVENOUS | Status: DC | PRN

## 2018-10-08 MED ORDER — LABETALOL HCL 5 MG/ML IV SOLN: 40.0000 mg | INTRAVENOUS | Status: DC | PRN

## 2018-10-08 MED ORDER — LABETALOL HCL 5 MG/ML IV SOLN: 80.0000 mg | INTRAVENOUS | Status: DC | PRN

## 2018-10-08 MED ORDER — HYDRALAZINE HCL 20 MG/ML IJ SOLN: 10.0000 mg | INTRAMUSCULAR | Status: DC | PRN

## 2018-10-08 NOTE — Discharge Summary (Signed)
Obstetric Discharge Summary Reason for Admission: Gest HTN Prenatal Procedures: NST  Intrapartum Procedures: N/A Postpartum Procedures: N/A Complications-Operative and Postpartum: N/A Hemoglobin  Date Value Ref Range Status  10/08/2018 11.8 (L) 12.0 - 15.0 g/dL Final   HCT  Date Value Ref Range Status  10/08/2018 35.7 (L) 36.0 - 46.0 % Final    Physical Exam:  General: A,A&O x3 HEART:S1S2, RRR, No M/R/G LUNGS: CTA BILAT, NO W/R/R VHQ:IONGEX Extrems: Neg DVT  NST reactive with 2 accels 15 x 15 BPM Accels: present Decels: none Reactive: yes Time: 1 hour  Discharge Diagnoses: IUP at 36 4/7 weeks  Discharge Information: Date: 10/08/2018 Activity: Rest at home, up ad lib Diet: Regular  Medications: PNV,Fe Condition: Stable  Instructions: FU Monday for BP check  Discharge to: home Labs: P:C ration non-calculable.  S/S of pre-ecclampsia discussed. Pt may return to work in am. FKC's.  _______________________________________  Kristina Rubio 10/08/2018, 6:38 PM

## 2018-10-08 NOTE — OB Triage Note (Signed)
Pt sent from [facility] clinic for preeclampsia evaluation. Pt states +FMand denies ctxs, LOF, or vaginal bleeding.

## 2018-10-08 NOTE — Discharge Instructions (Signed)
Preeclampsia and Eclampsia °Preeclampsia is a serious condition that develops only during pregnancy. It is also called toxemia of pregnancy. This condition causes high blood pressure along with other symptoms, such as swelling and headaches. These symptoms may develop as the condition gets worse. Preeclampsia may occur at 20 weeks of pregnancy or later. °Diagnosing and treating preeclampsia early is very important. If not treated early, it can cause serious problems for you and your baby. One problem it can lead to is eclampsia, which is a condition that causes muscle jerking or shaking (convulsions or seizures) in the mother. Delivering your baby is the best treatment for preeclampsia or eclampsia. Preeclampsia and eclampsia symptoms usually go away after your baby is born. °What are the causes? °The cause of preeclampsia is not known. °What increases the risk? °The following risk factors make you more likely to develop preeclampsia: °· Being pregnant for the first time. °· Having had preeclampsia during a past pregnancy. °· Having a family history of preeclampsia. °· Having high blood pressure. °· Being pregnant with twins or triplets. °· Being 35 or older. °· Being African-American. °· Having kidney disease or diabetes. °· Having medical conditions such as lupus or blood diseases. °· Being very overweight (obese). ° °What are the signs or symptoms? °The earliest signs of preeclampsia are: °· High blood pressure. °· Increased protein in your urine. Your health care provider will check for this at every visit before you give birth (prenatal visit). ° °Other symptoms that may develop as the condition gets worse include: °· Severe headaches. °· Sudden weight gain. °· Swelling of the hands, face, legs, and feet. °· Nausea and vomiting. °· Vision problems, such as blurred or double vision. °· Numbness in the face, arms, legs, and feet. °· Urinating less than usual. °· Dizziness. °· Slurred speech. °· Abdominal pain,  especially upper abdominal pain. °· Convulsions or seizures. ° °Symptoms generally go away after giving birth. °How is this diagnosed? °There are no screening tests for preeclampsia. Your health care provider will ask you about symptoms and check for signs of preeclampsia during your prenatal visits. You may also have tests that include: °· Urine tests. °· Blood tests. °· Checking your blood pressure. °· Monitoring your baby’s heart rate. °· Ultrasound. ° °How is this treated? °You and your health care provider will determine the treatment approach that is best for you. Treatment may include: °· Having more frequent prenatal exams to check for signs of preeclampsia, if you have an increased risk for preeclampsia. °· Bed rest. °· Reducing how much salt (sodium) you eat. °· Medicine to lower your blood pressure. °· Staying in the hospital, if your condition is severe. There, treatment will focus on controlling your blood pressure and the amount of fluids in your body (fluid retention). °· You may need to take medicine (magnesium sulfate) to prevent seizures. This medicine may be given as an injection or through an IV tube. °· Delivering your baby early, if your condition gets worse. You may have your labor started with medicine (induced), or you may have a cesarean delivery. ° °Follow these instructions at home: °Eating and drinking ° °· Drink enough fluid to keep your urine clear or pale yellow. °· Eat a healthy diet that is low in sodium. Do not add salt to your food. Check nutrition labels to see how much sodium a food or beverage contains. °· Avoid caffeine. °Lifestyle °· Do not use any products that contain nicotine or tobacco, such as cigarettes   and e-cigarettes. If you need help quitting, ask your health care provider. °· Do not use alcohol or drugs. °· Avoid stress as much as possible. Rest and get plenty of sleep. °General instructions °· Take over-the-counter and prescription medicines only as told by your  health care provider. °· When lying down, lie on your side. This keeps pressure off of your baby. °· When sitting or lying down, raise (elevate) your feet. Try putting some pillows underneath your lower legs. °· Exercise regularly. Ask your health care provider what kinds of exercise are best for you. °· Keep all follow-up and prenatal visits as told by your health care provider. This is important. °How is this prevented? °To prevent preeclampsia or eclampsia from developing during another pregnancy: °· Get proper medical care during pregnancy. Your health care provider may be able to prevent preeclampsia or diagnose and treat it early. °· Your health care provider may have you take a low-dose aspirin or a calcium supplement during your next pregnancy. °· You may have tests of your blood pressure and kidney function after giving birth. °· Maintain a healthy weight. Ask your health care provider for help managing weight gain during pregnancy. °· Work with your health care provider to manage any long-term (chronic) health conditions you have, such as diabetes or kidney problems. ° °Contact a health care provider if: °· You gain more weight than expected. °· You have headaches. °· You have nausea or vomiting. °· You have abdominal pain. °· You feel dizzy or light-headed. °Get help right away if: °· You develop sudden or severe swelling anywhere in your body. This usually happens in the legs. °· You gain 5 lbs (2.3 kg) or more during one week. °· You have severe: °? Abdominal pain. °? Headaches. °? Dizziness. °? Vision problems. °? Confusion. °? Nausea or vomiting. °· You have a seizure. °· You have trouble moving any part of your body. °· You develop numbness in any part of your body. °· You have trouble speaking. °· You have any abnormal bleeding. °· You pass out. °This information is not intended to replace advice given to you by your health care provider. Make sure you discuss any questions you have with your health  care provider. °Document Released: 11/16/2000 Document Revised: 07/17/2016 Document Reviewed: 06/25/2016 °Elsevier Interactive Patient Education © 2018 [employer] ° °

## 2018-10-14 ENCOUNTER — Other Ambulatory Visit: Payer: Self-pay | Admitting: Obstetrics and Gynecology

## 2018-10-14 NOTE — Progress Notes (Signed)
Orders for IOL admission.

## 2018-10-23 ENCOUNTER — Inpatient Hospital Stay: Payer: 59 | Admitting: Anesthesiology

## 2018-10-23 ENCOUNTER — Inpatient Hospital Stay
Admission: EM | Admit: 2018-10-23 | Discharge: 2018-10-25 | DRG: 807 | Disposition: A | Discharge status: Home / Self Care | Payer: 59 | Attending: Obstetrics and Gynecology | Admitting: Obstetrics and Gynecology

## 2018-10-23 ENCOUNTER — Other Ambulatory Visit: Payer: Self-pay

## 2018-10-23 DIAGNOSIS — O43123 Velamentous insertion of umbilical cord, third trimester: Secondary | ICD-10-CM | POA: Diagnosis present

## 2018-10-23 DIAGNOSIS — O134 Gestational [pregnancy-induced] hypertension without significant proteinuria, complicating childbirth: Principal | ICD-10-CM | POA: Diagnosis present

## 2018-10-23 DIAGNOSIS — O99214 Obesity complicating childbirth: Secondary | ICD-10-CM | POA: Diagnosis present

## 2018-10-23 DIAGNOSIS — E669 Obesity, unspecified: Secondary | ICD-10-CM | POA: Diagnosis present

## 2018-10-23 DIAGNOSIS — Z8759 Personal history of other complications of pregnancy, childbirth and the puerperium: Secondary | ICD-10-CM | POA: Diagnosis present

## 2018-10-23 DIAGNOSIS — O133 Gestational [pregnancy-induced] hypertension without significant proteinuria, third trimester: Secondary | ICD-10-CM | POA: Diagnosis present

## 2018-10-23 DIAGNOSIS — Z3A38 38 weeks gestation of pregnancy: Secondary | ICD-10-CM | POA: Diagnosis not present

## 2018-10-23 LAB — CBC
HEMATOCRIT: 36.3 % (ref 36.0–46.0)
Hemoglobin: 12 g/dL (ref 12.0–15.0)
MCH: 29.7 pg (ref 26.0–34.0)
MCHC: 33.1 g/dL (ref 30.0–36.0)
MCV: 89.9 fL (ref 80.0–100.0)
Platelets: 288 10*3/uL (ref 150–400)
RBC: 4.04 MIL/uL (ref 3.87–5.11)
RDW: 14.4 % (ref 11.5–15.5)
WBC: 10.4 10*3/uL (ref 4.0–10.5)
nRBC: 0 % (ref 0.0–0.2)

## 2018-10-23 LAB — TYPE AND SCREEN
ABO/RH(D): O POS
Antibody Screen: NEGATIVE

## 2018-10-23 LAB — COMPREHENSIVE METABOLIC PANEL
ALT: 18 U/L (ref 0–44)
AST: 22 U/L (ref 15–41)
Albumin: 3.2 g/dL — ABNORMAL LOW (ref 3.5–5.0)
Alkaline Phosphatase: 145 U/L — ABNORMAL HIGH (ref 38–126)
Anion gap: 7 (ref 5–15)
BILIRUBIN TOTAL: 0.3 mg/dL (ref 0.3–1.2)
BUN: 9 mg/dL (ref 6–20)
CALCIUM: 8.7 mg/dL — AB (ref 8.9–10.3)
CO2: 21 mmol/L — AB (ref 22–32)
CREATININE: 0.55 mg/dL (ref 0.44–1.00)
Chloride: 109 mmol/L (ref 98–111)
GFR calc non Af Amer: >60 mL/min (ref >60)
GLUCOSE: 82 mg/dL (ref 70–99)
Potassium: 3.8 mmol/L (ref 3.5–5.1)
SODIUM: 137 mmol/L (ref 135–145)
TOTAL PROTEIN: 7 g/dL (ref 6.5–8.1)

## 2018-10-23 LAB — URINALYSIS, ROUTINE W REFLEX MICROSCOPIC
Bilirubin Urine: NEGATIVE
Glucose, UA: NEGATIVE mg/dL
Hgb urine dipstick: NEGATIVE
Ketones, ur: 5 mg/dL — AB
Leukocytes, UA: NEGATIVE
Nitrite: NEGATIVE
PROTEIN: NEGATIVE mg/dL
SPECIFIC GRAVITY, URINE: 1.006 (ref 1.005–1.030)
pH: 7 (ref 5.0–8.0)

## 2018-10-23 LAB — PROTEIN / CREATININE RATIO, URINE: Creatinine, Urine: 43 mg/dL

## 2018-10-23 MED ORDER — PHENYLEPHRINE 40 MCG/ML (10ML) SYRINGE FOR IV PUSH (FOR BLOOD PRESSURE SUPPORT): 80.0000 ug | PREFILLED_SYRINGE | INTRAVENOUS | Status: DC | PRN

## 2018-10-23 MED ORDER — OXYTOCIN 10 UNIT/ML IJ SOLN
INTRAMUSCULAR | Status: AC
  Filled 2018-10-23: qty 2

## 2018-10-23 MED ORDER — LACTATED RINGERS IV SOLN: 500.0000 mL | INTRAVENOUS | Status: DC | PRN

## 2018-10-23 MED ORDER — LIDOCAINE HCL (PF) 1 % IJ SOLN: 30.0000 mL | INTRAMUSCULAR | Status: DC | PRN

## 2018-10-23 MED ORDER — MISOPROSTOL 50MCG HALF TABLET
50.0000 ug | ORAL_TABLET | ORAL | Status: DC
  Administered 2018-10-23: 50 ug via BUCCAL

## 2018-10-23 MED ORDER — ONDANSETRON HCL 4 MG/2ML IJ SOLN: 4.0000 mg | Freq: Four times a day (QID) | INTRAMUSCULAR | Status: DC | PRN

## 2018-10-23 MED ORDER — FENTANYL CITRATE (PF) 100 MCG/2ML IJ SOLN
100.0000 ug | INTRAMUSCULAR | Status: DC | PRN
  Administered 2018-10-23: 100 ug via INTRAVENOUS
  Filled 2018-10-23 (×2): qty 2

## 2018-10-23 MED ORDER — OXYTOCIN BOLUS FROM INFUSION
500.0000 mL | Freq: Once | INTRAVENOUS | Status: AC
  Administered 2018-10-24: 500 mL via INTRAVENOUS

## 2018-10-23 MED ORDER — TERBUTALINE SULFATE 1 MG/ML IJ SOLN: 0.2500 mg | Freq: Once | INTRAMUSCULAR | Status: DC | PRN

## 2018-10-23 MED ORDER — FENTANYL 2.5 MCG/ML W/ROPIVACAINE 0.15% IN NS 100 ML EPIDURAL (ARMC)
12.0000 mL/h | EPIDURAL | Status: DC
  Administered 2018-10-24: 12 mL/h via EPIDURAL
  Filled 2018-10-23: qty 100

## 2018-10-23 MED ORDER — LACTATED RINGERS IV SOLN: 500.0000 mL | Freq: Once | INTRAVENOUS | Status: DC

## 2018-10-23 MED ORDER — OXYTOCIN 40 UNITS IN LACTATED RINGERS INFUSION - SIMPLE MED: 2.5000 [IU]/h | INTRAVENOUS | Status: DC

## 2018-10-23 MED ORDER — EPHEDRINE 5 MG/ML INJ: 10.0000 mg | INTRAVENOUS | Status: DC | PRN

## 2018-10-23 MED ORDER — ACETAMINOPHEN 325 MG PO TABS
650.0000 mg | ORAL_TABLET | ORAL | Status: DC | PRN
  Filled 2018-10-23 (×2): qty 2

## 2018-10-23 MED ORDER — FENTANYL 2.5 MCG/ML W/ROPIVACAINE 0.15% IN NS 100 ML EPIDURAL (ARMC)
EPIDURAL | Status: AC
  Filled 2018-10-23: qty 100

## 2018-10-23 MED ORDER — SOD CITRATE-CITRIC ACID 500-334 MG/5ML PO SOLN: 30.0000 mL | ORAL | Status: DC | PRN

## 2018-10-23 MED ORDER — BUPIVACAINE HCL (PF) 0.25 % IJ SOLN
INTRAMUSCULAR | Status: DC | PRN
  Administered 2018-10-23 (×2): 4 mL via EPIDURAL

## 2018-10-23 MED ORDER — LIDOCAINE-EPINEPHRINE (PF) 1.5 %-1:200000 IJ SOLN
INTRAMUSCULAR | Status: DC | PRN
  Administered 2018-10-23: 4 mL via PERINEURAL

## 2018-10-23 MED ORDER — LACTATED RINGERS IV SOLN
INTRAVENOUS | Status: DC
  Administered 2018-10-23 – 2018-10-24 (×5): via INTRAVENOUS

## 2018-10-23 MED ORDER — AMMONIA AROMATIC IN INHA
RESPIRATORY_TRACT | Status: AC
  Filled 2018-10-23: qty 10

## 2018-10-23 MED ORDER — MISOPROSTOL 200 MCG PO TABS
ORAL_TABLET | ORAL | Status: AC
  Administered 2018-10-23: 25 ug via BUCCAL
  Filled 2018-10-23: qty 4

## 2018-10-23 MED ORDER — LIDOCAINE HCL (PF) 1 % IJ SOLN
INTRAMUSCULAR | Status: AC
  Filled 2018-10-23: qty 30

## 2018-10-23 MED ORDER — LIDOCAINE HCL (PF) 1 % IJ SOLN
INTRAMUSCULAR | Status: DC | PRN
  Administered 2018-10-23: 3 mL via INTRADERMAL

## 2018-10-23 MED ORDER — MISOPROSTOL 25 MCG QUARTER TABLET
25.0000 ug | ORAL_TABLET | ORAL | Status: DC | PRN
  Administered 2018-10-23 (×2): 25 ug via VAGINAL
  Filled 2018-10-23 (×2): qty 1

## 2018-10-23 MED ORDER — MISOPROSTOL 25 MCG QUARTER TABLET
25.0000 ug | ORAL_TABLET | ORAL | Status: DC | PRN
  Administered 2018-10-23: 25 ug via BUCCAL
  Filled 2018-10-23 (×2): qty 1

## 2018-10-23 MED ORDER — OXYTOCIN 40 UNITS IN LACTATED RINGERS INFUSION - SIMPLE MED
1.0000 m[IU]/min | INTRAVENOUS | Status: DC
  Administered 2018-10-23: 2 m[IU]/min via INTRAVENOUS
  Filled 2018-10-23 (×2): qty 1000

## 2018-10-23 MED ORDER — DIPHENHYDRAMINE HCL 50 MG/ML IJ SOLN: 12.5000 mg | INTRAMUSCULAR | Status: DC | PRN

## 2018-10-23 NOTE — Progress Notes (Signed)
Labor Progress Note  Kristina MilchStephanie Rubio is a 27 y.o. G1P0000 at 6463w5d by LMP [facility]   Subjective: Cook cath fell out while up to BR, s/p 1 dose of pain meds which helped.   Objective: BP 109/61   Pulse 70   Temp 98.6 F (37 C) (Oral)   Resp 16   Ht 5\' 3"  (1.6 m)   Wt 97.1 kg   LMP 01/25/2018   BMI 37.91 kg/m  Notable VS details: mostly normotensive, occasional mild range BP.  Fetal Assessment: FHT:  FHR: 135 bpm, variability: moderate,  accelerations:  Present,  decelerations:  Absent Category/reactivity:  Category I UC:   regular, every 1-5 minutes SVE:   5/50/-3, soft/posterior, normal bloody show Membrane status: intact Amniotic color: n/a  Labs: Lab Results  Component Value Date   WBC 10.4 10/23/2018   HGB 12.0 10/23/2018   HCT 36.3 10/23/2018   MCV 89.9 10/23/2018   PLT 288 10/23/2018    Assessment / Plan: 27 y.o. G1P0000 female at 3463w5d with IOL for GHTN   Labor: s/p 3 doses Cytotec [facility], last dose cytotec 1545, will plan to start Pitocin.  Preeclampsia:  no s/sx pre-e Fetal Wellbeing:  Category I Pain Control:  IV pain meds I/D:  n/a Anticipated MOD:  NSVD  Prudencio Pairebecca A McVey, CNM 10/23/2018, 6:20 PM

## 2018-10-23 NOTE — Anesthesia Preprocedure Evaluation (Signed)
Anesthesia Evaluation  Patient identified by MRN, date of birth, ID band Patient awake    Reviewed: Allergy & Precautions, H&P , NPO status , Patient's Chart, lab work & pertinent test results, reviewed documented beta blocker date and time   Airway Mallampati: II  TM Distance: >3 FB Neck ROM: full    Dental no notable dental hx. (+) Teeth Intact   Pulmonary neg pulmonary ROS, Current Smoker,    Pulmonary exam normal breath sounds clear to auscultation       Cardiovascular Exercise Tolerance: Good hypertension, On Medications  Rhythm:regular Rate:Normal     Neuro/Psych negative neurological ROS  negative psych ROS   GI/Hepatic negative GI ROS, Neg liver ROS,   Endo/Other  negative endocrine ROSdiabetes, Well Controlled, Type 2, Oral Hypoglycemic Agents  Renal/GU      Musculoskeletal   Abdominal   Peds  Hematology negative hematology ROS (+)   Anesthesia Other Findings   Reproductive/Obstetrics (+) Pregnancy                             Anesthesia Physical Anesthesia Plan  ASA: II  Anesthesia Plan: Epidural   Post-op Pain Management:    Induction:   PONV Risk Score and Plan:   Airway Management Planned:   Additional Equipment:   Intra-op Plan:   Post-operative Plan:   Informed Consent: I have reviewed the patients History and Physical, chart, labs and discussed the procedure including the risks, benefits and alternatives for the proposed anesthesia with the patient or authorized representative who has indicated his/her understanding and acceptance.     Plan Discussed with:   Anesthesia Plan Comments:         Anesthesia Quick Evaluation

## 2018-10-23 NOTE — Anesthesia Procedure Notes (Signed)
Epidural Patient location during procedure: OB  Preanesthetic Checklist Completed: patient identified, site marked, surgical consent, pre-op evaluation, timeout performed, IV checked, risks and benefits discussed and monitors and equipment checked  Epidural Patient position: sitting Prep: Betadine Patient monitoring: heart rate, continuous pulse ox and blood pressure Approach: midline Location: L4-L5 Injection technique: LOR air  Needle:  Needle type: Tuohy  Needle gauge: 17 G Needle length: 9 cm and 9 Needle insertion depth: 6 cm Catheter type: closed end flexible Catheter size: 19 Gauge Catheter at skin depth: 11 cm Test dose: negative and 1.5% lidocaine with Epi 1:200 K  Assessment Sensory level: T11 Events: blood not aspirated, injection not painful, no injection resistance, negative IV test and no paresthesia  Additional Notes Pt's history reviewed and consent obtained as per OB consent Patient tolerated the insertion well without complications. Negative SATD, negative IVTD All VSS were obtained and monitored through OBIX and nursing protocols followed.Reason for block:procedure for pain

## 2018-10-23 NOTE — H&P (Signed)
OB History & Physical   History of Present Illness:  Chief Complaint: here for induction  HPI:  Kristina Rubio is a 27 y.o. G1P0000 female at [redacted]w[redacted]d dated by LMP and c/w  [place] at [redacted]w[redacted]d.  She presents to L&D for induction of labor due to [facility] dx at 34wks. Reports active FM, onset of ctx after 2nd dose of cytotec, currently every 4-6 minutes; denies VB or LOF. Denies HA, VD or RUQ pain.   Pregnancy Issues: 1. Obesity, BMI 35 pre-preg 2. Varicella non-immune 3. GHTN 4. RUQ pain 2nd trimester, negative RUQ [place] 08/2018, no stones.    Maternal Medical History:   Past Medical History:  Diagnosis Date  . PCOS (polycystic ovarian syndrome)     History reviewed. No pertinent surgical history.  No Known Allergies  Prior to Admission medications   Medication Sig Start Date End Date Taking? Authorizing Provider  aspirin 81 MG chewable tablet Chew 81 mg by mouth daily.   Yes [provider]  Prenat w/o A-FeCbGl-DSS-FA-DHA (CITRANATAL ASSURE) 35-1 & 300 MG tablet Take 1 tablet by mouth daily. 02/24/18  Yes Hildred Laser, MD  BIOTIN PO Take by mouth.    [provider]  cefdinir (OMNICEF) 300 MG capsule Take 300 mg by mouth 2 (two) times daily.    [provider]  letrozole (FEMARA) 2.5 MG tablet Take 2 tablets (5 mg total) by mouth daily. Take for 5 days, beginning on Day 3 of menstrual cycle. Patient not taking: Reported on 02/28/2018 01/06/18   Hildred Laser, MD  medroxyPROGESTERone (PROVERA) 10 MG tablet TAKE 1 TABLET (10 MG TOTAL) BY MOUTH DAILY. USE FOR SEVEN DAYS Patient not taking: Reported on 02/24/2018 09/30/17   Hildred Laser, MD  metFORMIN (GLUCOPHAGE) 500 MG tablet TAKE 1 TABLET BY MOUTH TWICE A DAY Patient not taking: Reported on 02/24/2018 01/07/18   Hildred Laser, MD     Prenatal care site: [facility]    Social History: She  reports that she has never smoked. She has never used smokeless tobacco. She reports that she drinks alcohol. She reports  that she does not use drugs.  Family History: family history includes Anemia in her mother.   Review of Systems: A full review of systems was performed and negative except as noted in the HPI.     Physical Exam:  Vital Signs: BP 125/72 (BP Location: Right Arm)   Pulse 79   Temp 98.1 F (36.7 C) (Oral)   Resp 16   Ht 5\' 3"  (1.6 m)   Wt 97.1 kg   LMP 01/25/2018   BMI 37.91 kg/m    General: no acute distress.  HEENT: normocephalic, atraumatic Heart: regular rate & rhythm.  No murmurs/rubs/gallops Lungs: clear to auscultation bilaterally, normal respiratory effort Abdomen: soft, gravid, non-tender;  EFW: 7.5lbs Pelvic:   External: Normal external female genitalia  Cervix: Dilation: 1 / Effacement (%): 50 /   -2, soft/posterior - Cook Cath placed, pt tolerated well. Uterine and vaginal balloons inflated with 80ml each.    Extremities: non-tender, symmetric, trace edema LE  bilaterally.  DTRs: 2+  Neurologic: Alert & oriented x 3.    Results for orders placed or performed during the hospital encounter of 10/23/18 (from the past 24 hour(s))  Type and screen     Status: None   Collection Time: 10/23/18  1:16 AM  Result Value Ref Range   ABO/RH(D) O POS    Antibody Screen NEG    Sample Expiration  10/26/2018 Performed at [facility], [address] [place], [place] [address]   CBC     Status: None   Collection Time: 10/23/18  1:17 AM  Result Value Ref Range   WBC 10.4 4.0 - 10.5 K/uL   RBC 4.04 3.87 - 5.11 MIL/uL   Hemoglobin 12.0 12.0 - 15.0 g/dL   HCT 60.4 54.0 - 98.1 %   MCV 89.9 80.0 - 100.0 fL   MCH 29.7 26.0 - 34.0 pg   MCHC 33.1 30.0 - 36.0 g/dL   RDW 19.1 47.8 - 29.5 %   Platelets 288 150 - 400 K/uL   nRBC 0.0 0.0 - 0.2 %  Comprehensive metabolic panel     Status: Abnormal   Collection Time: 10/23/18  1:17 AM  Result Value Ref Range   Sodium 137 135 - 145 mmol/L   Potassium 3.8 3.5 - 5.1 mmol/L   Chloride 109 98 - 111 mmol/L   CO2 21 (L) 22  - 32 mmol/L   Glucose, Bld 82 70 - 99 mg/dL   BUN 9 6 - 20 mg/dL   Creatinine, Ser 6.21 0.44 - 1.00 mg/dL   Calcium 8.7 (L) 8.9 - 10.3 mg/dL   Total Protein 7.0 6.5 - 8.1 g/dL   Albumin 3.2 (L) 3.5 - 5.0 g/dL   AST 22 15 - 41 U/L   ALT 18 0 - 44 U/L   Alkaline Phosphatase 145 (H) 38 - 126 U/L   Total Bilirubin 0.3 0.3 - 1.2 mg/dL   GFR calc non Af Amer >60 >60 mL/min   GFR calc Af Amer >60 >60 mL/min   Anion gap 7 5 - 15  Protein / creatinine ratio, urine     Status: None   Collection Time: 10/23/18  1:17 AM  Result Value Ref Range   Creatinine, Urine 43 mg/dL   Total Protein, Urine <6 mg/dL   Protein Creatinine Ratio        0.00 - 0.15 mg/mg[Cre]  Urinalysis, Routine w reflex microscopic     Status: Abnormal   Collection Time: 10/23/18  1:17 AM  Result Value Ref Range   Color, Urine STRAW (A) YELLOW   APPearance CLEAR (A) CLEAR   Specific Gravity, Urine 1.006 1.005 - 1.030   pH 7.0 5.0 - 8.0   Glucose, UA NEGATIVE NEGATIVE mg/dL   Hgb urine dipstick NEGATIVE NEGATIVE   Bilirubin Urine NEGATIVE NEGATIVE   Ketones, ur 5 (A) NEGATIVE mg/dL   Protein, ur NEGATIVE NEGATIVE mg/dL   Nitrite NEGATIVE NEGATIVE   Leukocytes, UA NEGATIVE NEGATIVE    Pertinent Results:  Prenatal Labs: Blood type/Rh O Pos  Antibody screen neg  Rubella Immune  Varicella NON-Immune  RPR NR  HBsAg Neg  HIV NR  GC neg  Chlamydia neg  Genetic screening negative  1 hour GTT  106  GBS  Neg   FHT: 125bpm, Mod variability, + accels, no decels.  TOCO: UCs q4-31min, palp mild SVE:  Dilation: 1 / Effacement (%): 50 /      Cephalic by leopolds  No results found.  Assessment:  Kristina Rubio is a 27 y.o. G1P0000 female at [redacted]w[redacted]d with IOL for GHTN   Plan:  1. Admit to Labor & Delivery; consents reviewed and obtained - baseline CMP and P/C ratio WNL - monitor closely for elevating BPs or sx Pre-e  2. Fetal Well being  - Fetal Tracing: Cat I  - Group B Streptococcus ppx indicated: neg -  Presentation: cephalic confirmed  by SVE and Leopolds   3. Routine OB: - Prenatal labs reviewed, as above - Rh O Pos - CBC, T&S, RPR on admit - Clear fluids, IVF  4. Induction of Labor -  Contractions: external toco in place, UC patter q3-5 now [facility].  -  Pelvis unproven, adequate for TOL -  Plan for induction with Cytotec and Cook Cath, plan Pit/AROM after catheter out.  -  Plan for continuous fetal monitoring  -  Maternal pain control as desired; reviewed option IVPM, nitrous, regional anesthesia - Anticipate vaginal delivery   Randa NgoRebecca A Leiland Mihelich, CNM 10/23/18 12:15 PM

## 2018-10-24 LAB — RPR: RPR Ser Ql: NONREACTIVE

## 2018-10-24 MED ORDER — MEASLES, MUMPS & RUBELLA VAC IJ SOLR
0.5000 mL | Freq: Once | INTRAMUSCULAR | Status: DC
  Filled 2018-10-24: qty 0.5

## 2018-10-24 MED ORDER — IBUPROFEN 600 MG PO TABS
600.0000 mg | ORAL_TABLET | ORAL | Status: DC | PRN
  Filled 2018-10-24: qty 1

## 2018-10-24 MED ORDER — PRENATAL MULTIVITAMIN CH
1.0000 | ORAL_TABLET | Freq: Every day | ORAL | Status: DC
  Administered 2018-10-24: 1 via ORAL
  Filled 2018-10-24: qty 1

## 2018-10-24 MED ORDER — DIBUCAINE 1 % RE OINT: 1.0000 "application " | TOPICAL_OINTMENT | RECTAL | Status: DC | PRN

## 2018-10-24 MED ORDER — BENZOCAINE-MENTHOL 20-0.5 % EX AERO: 1.0000 "application " | INHALATION_SPRAY | CUTANEOUS | Status: DC | PRN

## 2018-10-24 MED ORDER — OXYCODONE HCL 5 MG PO TABS: 5.0000 mg | ORAL_TABLET | ORAL | Status: DC | PRN

## 2018-10-24 MED ORDER — SENNOSIDES-DOCUSATE SODIUM 8.6-50 MG PO TABS: 2.0000 | ORAL_TABLET | ORAL | Status: DC

## 2018-10-24 MED ORDER — ZOLPIDEM TARTRATE 5 MG PO TABS: 5.0000 mg | ORAL_TABLET | Freq: Every evening | ORAL | Status: DC | PRN

## 2018-10-24 MED ORDER — FLEET ENEMA 7-19 GM/118ML RE ENEM: 1.0000 | ENEMA | Freq: Every day | RECTAL | Status: DC | PRN

## 2018-10-24 MED ORDER — ACETAMINOPHEN 325 MG PO TABS
650.0000 mg | ORAL_TABLET | ORAL | Status: DC | PRN
  Administered 2018-10-24 (×2): 650 mg via ORAL

## 2018-10-24 MED ORDER — TETANUS-DIPHTH-ACELL PERTUSSIS 5-2.5-18.5 LF-MCG/0.5 IM SUSP: 0.5000 mL | Freq: Once | INTRAMUSCULAR | Status: DC

## 2018-10-24 MED ORDER — IBUPROFEN 600 MG PO TABS
600.0000 mg | ORAL_TABLET | Freq: Four times a day (QID) | ORAL | Status: DC
  Administered 2018-10-24 – 2018-10-25 (×4): 600 mg via ORAL
  Filled 2018-10-24 (×3): qty 1

## 2018-10-24 MED ORDER — SODIUM CHLORIDE 0.9% FLUSH: 3.0000 mL | Freq: Two times a day (BID) | INTRAVENOUS | Status: DC

## 2018-10-24 MED ORDER — SODIUM CHLORIDE 0.9% FLUSH: 3.0000 mL | INTRAVENOUS | Status: DC | PRN

## 2018-10-24 MED ORDER — WITCH HAZEL-GLYCERIN EX PADS: 1.0000 "application " | MEDICATED_PAD | CUTANEOUS | Status: DC | PRN

## 2018-10-24 MED ORDER — SODIUM CHLORIDE 0.9 % IV SOLN: 250.0000 mL | INTRAVENOUS | Status: DC | PRN

## 2018-10-24 MED ORDER — DIPHENHYDRAMINE HCL 25 MG PO CAPS: 25.0000 mg | ORAL_CAPSULE | Freq: Four times a day (QID) | ORAL | Status: DC | PRN

## 2018-10-24 MED ORDER — OXYTOCIN 40 UNITS IN LACTATED RINGERS INFUSION - SIMPLE MED
INTRAVENOUS | Status: AC
  Filled 2018-10-24: qty 1000

## 2018-10-24 MED ORDER — ONDANSETRON HCL 4 MG PO TABS: 4.0000 mg | ORAL_TABLET | ORAL | Status: DC | PRN

## 2018-10-24 MED ORDER — BISACODYL 10 MG RE SUPP: 10.0000 mg | Freq: Every day | RECTAL | Status: DC | PRN

## 2018-10-24 MED ORDER — ONDANSETRON HCL 4 MG/2ML IJ SOLN: 4.0000 mg | INTRAMUSCULAR | Status: DC | PRN

## 2018-10-24 MED ORDER — COCONUT OIL OIL: 1.0000 "application " | TOPICAL_OIL | Status: DC | PRN

## 2018-10-24 MED ORDER — SIMETHICONE 80 MG PO CHEW: 80.0000 mg | CHEWABLE_TABLET | ORAL | Status: DC | PRN

## 2018-10-24 NOTE — Discharge Summary (Signed)
  Obstetrical Discharge Summary  Patient Name: Kristina MilchStephanie Rubio DOB: December 12, 1990 MRN: [id number]  Date of Admission: 10/23/2018 Date of Delivery: 10/24/18 Delivered by: Milon Scorearon Aniko Finnigan, CNM Date of Discharge: 10/24/2018  [facility]  HYQ:MVHQION'GLMP:Patient's last menstrual period was 01/25/2018. EDC Estimated Date of Delivery: 11/01/18 Gestational Age at Delivery: 7033w6d   Antepartum complications: Gest HTN with no pre-ecclampsia  Admitting Diagnosis: Term pregnancy for IOL due to Gest HTN Secondary Diagnosis:Possible Abruption Patient Active Problem List   Diagnosis Date Noted  . Gestational hypertension w/o significant proteinuria in 3rd trimester 10/23/2018  . 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 Obesity (BMI 30.0-34.9) 11/02/2015  . PCOS (polycystic ovarian syndrome) 11/02/2015    Augmentation: Pitocin and Cytotec Complications: Placental Abruption Intrapartum complications/course:  Date of Delivery: 10/24/18 Delivered By: Milon Scorearon Keanthony Poole, CNM Delivery Type: spontaneous vaginal delivery Anesthesia: epidural Placenta: spontaneous Laceration: 1st degree lac of perineum and Lt peri-urethral Episiotomy: none Newborn Data: Live born female  Birth Weight:   APGAR: 7, 8  Newborn Delivery   Birth date/time:  10/24/2018 08:34:00 Delivery type:  Vaginal, Spontaneous    Postpartum Procedures: N/A  Post partum course:  Patient had an uncomplicated postpartum course.  By time of discharge on PPD#1 her pain was controlled on oral pain medications; she had appropriate lochia and was ambulating, voiding without difficulty and tolerating regular diet.  She was deemed stable for discharge to home.     Discharge Physical Exam:  BP 125/78 (BP Location: Left Arm)   Pulse 90   Temp 99.1 F (37.3 C) (Axillary)   Resp 18   Ht 5\' 3"  (1.6 m)   Wt 97.1 kg   LMP 01/25/2018   SpO2 98%   BMI 37.91 kg/m   General: NAD CV: RRR Pulm: CTABL, nl effort ABD:  s/nd/nt, fundus firm and below the umbilicus Lochia: moderate Incision: Perineum intact, no hematoma DVT Evaluation: LE non-ttp, no evidence of DVT on exam. Voiding WNL, Taking po well  Hemoglobin  Date Value Ref Range Status  10/23/2018 12.0 12.0 - 15.0 g/dL Final   HCT  Date Value Ref Range Status  10/23/2018 36.3 36.0 - 46.0 % Final   Disposition: stable, discharge to home. Baby Feeding: formula Baby Disposition: home with mom  Rh Immune globulin given: O pps Rubella vaccine given: Immune Tdap vaccine given in AP or PP setting: 08/05/18 Flu vaccine given in AP or PP setting: at her job 2019 Contraception:   Prenatal Labs:  Type and RH: O pos Antibody: neg HIV:NR RPR:Neg Varicella:Non-immune Rubella:Immune GBS neg GC/CH: Neg   Plan:  Kristina Rubio was discharged to home in good condition. Follow-up appointment with delivering provider in 6 weeks. Pt is considering the IUD Liletta. Discharge Medications: Ibuprofen, Vicodin, Fe, PNV   _________________________________________ Signed: Myrtie Cruisearon W. Levell Tavano,RN, MSN, CNM, FNP Certified Nurse Midwife [facility]

## 2018-10-24 NOTE — Progress Notes (Signed)
Labor progress note  SUBJECTIVE: feeling rectal pressure   OBJECTIVE: BP 130/86   Pulse 88   Temp 98.3 F (36.8 C) (Oral)   Resp 16   Ht 5\' 3"  (1.6 m)   Wt 97.1 kg   LMP 01/25/2018   SpO2 100%   BMI 37.91 kg/m    Fetal Assessment: FHT:FHR:125bpm, variability: moderate, accelerations: Present, decelerations: absent   Category/reactivity:Category I OZ:HYQMVHQC:regular, every2-703minutes; Pitocin currently at 62mu/min SVE:C/C/+2 Membrane status:clear  ASSESSMENT: Second stage labor- will labor down G1P0 with IOL at 38+6wks for GHTN  PLAN: Plan to labor down  Anticipate SVD   Randa Ngoebecca A Osiris Charles, CNM 7:03 AM 10/24/2018

## 2018-10-24 NOTE — Discharge Instructions (Signed)
Vaginal Delivery, Care After °Refer to this sheet in the next few weeks. These instructions provide you with information about caring for yourself after vaginal delivery. Your health care provider may also give you more specific instructions. Your treatment has been planned according to current medical practices, but problems sometimes occur. Call your health care provider if you have any problems or questions. °What can I expect after the procedure? °After vaginal delivery, it is common to have: °· Some bleeding from your vagina. °· Soreness in your abdomen, your vagina, and the area of skin between your vaginal opening and your anus (perineum). °· Pelvic cramps. °· Fatigue. ° °Follow these instructions at home: °Medicines °· Take over-the-counter and prescription medicines only as told by your health care provider. °· If you were prescribed an antibiotic medicine, take it as told by your health care provider. Do not stop taking the antibiotic until it is finished. °Driving ° °· Do not drive or operate heavy machinery while taking prescription pain medicine. °· Do not drive for 24 hours if you received a sedative. °Lifestyle °· Do not drink alcohol. This is especially important if you are breastfeeding or taking medicine to relieve pain. °· Do not use tobacco products, including cigarettes, chewing tobacco, or e-cigarettes. If you need help quitting, ask your health care provider. °Eating and drinking °· Drink at least 8 eight-ounce glasses of water every day unless you are told not to by your health care provider. If you choose to breastfeed your baby, you may need to drink more water than this. °· Eat high-fiber foods every day. These foods may help prevent or relieve constipation. High-fiber foods include: °? Whole grain cereals and breads. °? Brown rice. °? Beans. °? Fresh fruits and vegetables. °Activity °· Return to your normal activities as told by your health care provider. Ask your health care provider  what activities are safe for you. °· Rest as much as possible. Try to rest or take a nap when your baby is sleeping. °· Do not lift anything that is heavier than your baby or 10 lb (4.5 kg) until your health care provider says that it is safe. °· Talk with your health care provider about when you can engage in sexual activity. This may depend on your: °? Risk of infection. °? Rate of healing. °? Comfort and desire to engage in sexual activity. °Vaginal Care °· If you have an episiotomy or a vaginal tear, check the area every day for signs of infection. Check for: °? More redness, swelling, or pain. °? More fluid or blood. °? Warmth. °? Pus or a bad smell. °· Do not use tampons or douches until your health care provider says this is safe. °· Watch for any blood clots that may pass from your vagina. These may look like clumps of dark red, brown, or black discharge. °General instructions °· Keep your perineum clean and dry as told by your health care provider. °· Wear loose, comfortable clothing. °· Wipe from front to back when you use the toilet. °· Ask your health care provider if you can shower or take a bath. If you had an episiotomy or a perineal tear during labor and delivery, your health care provider may tell you not to take baths for a certain length of time. °· Wear a bra that supports your breasts and fits you well. °· If possible, have someone help you with household activities and help care for your baby for at least a few days after   you leave the hospital. °· Keep all follow-up visits for you and your baby as told by your health care provider. This is important. °Contact a health care provider if: °· You have: °? Vaginal discharge that has a bad smell. °? Difficulty urinating. °? Pain when urinating. °? A sudden increase or decrease in the frequency of your bowel movements. °? More redness, swelling, or pain around your episiotomy or vaginal tear. °? More fluid or blood coming from your episiotomy or  vaginal tear. °? Pus or a bad smell coming from your episiotomy or vaginal tear. °? A fever. °? A rash. °? Little or no interest in activities you used to enjoy. °? Questions about caring for yourself or your baby. °· Your episiotomy or vaginal tear feels warm to the touch. °· Your episiotomy or vaginal tear is separating or does not appear to be healing. °· Your breasts are painful, hard, or turn red. °· You feel unusually sad or worried. °· You feel nauseous or you vomit. °· You pass large blood clots from your vagina. If you pass a blood clot from your vagina, save it to show to your health care provider. Do not flush blood clots down the toilet without having your health care provider look at them. °· You urinate more than usual. °· You are dizzy or light-headed. °· You have not breastfed at all and you have not had a menstrual period for 12 weeks after delivery. °· You have stopped breastfeeding and you have not had a menstrual period for 12 weeks after you stopped breastfeeding. °Get help right away if: °· You have: °? Pain that does not go away or does not get better with medicine. °? Chest pain. °? Difficulty breathing. °? Blurred vision or spots in your vision. °? Thoughts about hurting yourself or your baby. °· You develop pain in your abdomen or in one of your legs. °· You develop a severe headache. °· You faint. °· You bleed from your vagina so much that you fill two sanitary pads in one hour. °This information is not intended to replace advice given to you by your health care provider. Make sure you discuss any questions you have with your health care provider. °Document Released: 11/16/2000 Document Revised: 05/02/2016 Document Reviewed: 12/04/2015 °Elsevier Interactive Patient Education © 2018 Elsevier Inc[employer] ° °

## 2018-10-24 NOTE — Lactation Note (Addendum)
This note was copied from a baby's chart. Lactation Consultation Note  Patient Name: Kristina Daralene MilchStephanie Mickler ZOXWR'UToday's Date: 10/24/2018 Reason for consult: Initial assessment;Primapara Mom had breastfed baby on left breast x 15 min., unable to latch baby to her right breast, mom wanting to give formula because she states she does not have enough milk, baby fussy, I assisted her to latch baby to right breast but baby very fussy and would not latch after 4 attempts Maternal Data Formula Feeding for Exclusion: No Does the patient have breastfeeding experience prior to this delivery?: No  Feeding Feeding Type: Breast Fed  LATCH Score Latch: Too sleepy or reluctant, no latch achieved, no sucking elicited.                 Interventions Interventions: Assisted with latch;Skin to skin  Lactation Tools Discussed/Used WIC Program: Yes   Consult Status Consult Status: Follow-up Date: 10/24/18 Follow-up type: In-patient  Assist pt with latch at next feeding   Dyann KiefMarsha D Janece Laidlaw 10/24/2018, 2:03 PM

## 2018-10-24 NOTE — Progress Notes (Signed)
Labor Progress Note  Daralene MilchStephanie Fogal is a 27 y.o. G1P0000 at 3662w6d by LMP [facility]   Subjective: comfortable with epidural; called to bedside by nursing with concern for difficulty tracing UCs and periods of decreased variability and early decels.   Objective: BP (!) 111/56   Pulse 63   Temp 98.3 F (36.8 C) (Oral)   Resp 16   Ht 5\' 3"  (1.6 m)   Wt 97.1 kg   LMP 01/25/2018   SpO2 100%   BMI 37.91 kg/m  Notable VS details: normotensive  Fetal Assessment: FHT:  FHR: 120 bpm, variability: moderate,  accelerations:  Present,  decelerations:  Absent Category/reactivity:  Category I UC:   regular, every 2-3 minutes, pitocin at 673mu/min  SVE:   5/90/-1 with BBOW AROM performed, mod amt clear fluid with scant bloody show noted, IUPC inserted without difficulty and FSE applied. UPon resuming continuous tracing, FHR 115bpm with moderate variability, early/variable decels noted while pt positioned on Right lateral, Pt repositioned to left lateral, IV bolus started and Pitocin DC'd. IUPC baseline noted to be 30-5035mmHg. Early decels continue, will continue to monitor closely.    Membrane status: AROM at 0240 Amniotic color: clear  Labs: Lab Results  Component Value Date   WBC 10.4 10/23/2018   HGB 12.0 10/23/2018   HCT 36.3 10/23/2018   MCV 89.9 10/23/2018   PLT 288 10/23/2018    Assessment / Plan: 27 y.o.G1P0000 female at 6562w6d [facility]; entering active labor now.   Labor: s/p 3 doses cytotec, cook cath, and pitocin. Inadequate MVUs, baseline now 25-5730mmHg. Will restart Pitocin in 30min to 1hr.  Preeclampsia:  no sx Pre-e Fetal Wellbeing:  Category I, early decels now, will continue to monitor closely.  Pain Control:  Epidural I/D:  n/a Anticipated MOD:  NSVD  Prudencio Pairebecca A , CNM 10/24/2018, 2:55 AM

## 2018-10-25 LAB — CBC
HCT: 30.4 % — ABNORMAL LOW (ref 36.0–46.0)
Hemoglobin: 9.8 g/dL — ABNORMAL LOW (ref 12.0–15.0)
MCH: 29.7 pg (ref 26.0–34.0)
MCHC: 32.2 g/dL (ref 30.0–36.0)
MCV: 92.1 fL (ref 80.0–100.0)
PLATELETS: 238 10*3/uL (ref 150–400)
RBC: 3.3 MIL/uL — AB (ref 3.87–5.11)
RDW: 14.6 % (ref 11.5–15.5)
WBC: 17.8 10*3/uL — ABNORMAL HIGH (ref 4.0–10.5)
nRBC: 0 % (ref 0.0–0.2)

## 2018-10-25 NOTE — Anesthesia Postprocedure Evaluation (Signed)
Anesthesia Post Note  Patient: Kristina MilchStephanie Pfohl  Procedure(s) Performed: AN AD HOC LABOR EPIDURAL  Patient location during evaluation: Mother Baby Anesthesia Type: Epidural Level of consciousness: awake and alert and oriented Pain management: pain level controlled Vital Signs Assessment: post-procedure vital signs reviewed and stable Respiratory status: spontaneous breathing Cardiovascular status: blood pressure returned to baseline Postop Assessment: no headache, no backache and patient able to bend at knees Anesthetic complications: no     Last Vitals:  Vitals:   10/25/18 0104 10/25/18 0744  BP: 119/65 119/69  Pulse: 86 93  Resp: 20 18  Temp: 36.9 C 36.7 C  SpO2: 98% 99%    Last Pain:  Vitals:   10/25/18 0744  TempSrc: Oral  PainSc:                  [name]

## 2018-10-25 NOTE — Progress Notes (Signed)
Discharge instructions given. Patient verbalizes understanding of teaching. Patient is varicella non immune but refuses vaccine. Patient states "I have taken it several times and I'm always still not immune to it so I don't want it." Provider, Milon Scorearon Jones, CNM, notified. Patient discharged home via wheelchair.

## 2018-10-28 LAB — SURGICAL PATHOLOGY

## 2019-11-10 ENCOUNTER — Ambulatory Visit (INDEPENDENT_AMBULATORY_CARE_PROVIDER_SITE_OTHER): Payer: Managed Care, Other (non HMO) | Admitting: Obstetrics and Gynecology

## 2019-11-10 ENCOUNTER — Encounter: Payer: Self-pay | Admitting: Obstetrics and Gynecology

## 2019-11-10 ENCOUNTER — Other Ambulatory Visit: Payer: Self-pay

## 2019-11-10 VITALS — BP 120/79 | HR 91 | Ht 63.0 in | Wt 207.3 lb

## 2019-11-10 DIAGNOSIS — N926 Irregular menstruation, unspecified: Secondary | ICD-10-CM

## 2019-11-10 DIAGNOSIS — Z8742 Personal history of other diseases of the female genital tract: Secondary | ICD-10-CM | POA: Diagnosis not present

## 2019-11-10 LAB — POCT URINE PREGNANCY: Preg Test, Ur: NEGATIVE

## 2019-11-10 MED ORDER — MEDROXYPROGESTERONE ACETATE 10 MG PO TABS: 10.0000 mg | ORAL_TABLET | Freq: Every day | ORAL | 6 refills | Status: DC

## 2019-11-10 NOTE — Progress Notes (Signed)
Pt present due to missed menses. Pt's LMP 08/09/19. Pt stated that she was doing well other than not having her cycle. UPT-NEG

## 2019-11-10 NOTE — Patient Instructions (Signed)

## 2019-11-10 NOTE — Progress Notes (Signed)
    GYNECOLOGY PROGRESS NOTE  Subjective:    Patient ID: Kristina Rubio, female    DOB: 01/24/1991, 28 y.o.   MRN: [id number]  HPI  Patient is a 28 y.o. G71P1001 female who presents for irregular menstrual cycles.  Patient has had a history of irregular menses in the past.  Also previously with a history of infertility requiring medications.  Has been worked up for PCOS in the past with negative findings.  She notes being on Metformin and Provera in the past.  Would like to resume.  Patient's last menstrual period was 08/09/2019.   The following portions of the patient's history were reviewed and updated as appropriate: allergies, current medications, past family history, past medical history, past social history, past surgical history and problem list.  Review of Systems Pertinent items noted in HPI and remainder of comprehensive ROS otherwise negative.   Objective:   Blood pressure 120/79, pulse 91, height 5\' 3"  (1.6 m), weight 207 lb 4.8 oz (94 kg), last menstrual period 08/09/2019, unknown if currently breastfeeding. General appearance: alert and no distress Remainder of exam deferred.    Labs:  Results for orders placed or performed in visit on 11/10/19  POCT urine pregnancy  Result Value Ref Range   Preg Test, Ur Negative Negative    Assessment:   1. Missed menses   2. History of irregular menstrual cycles     Plan:   1. Patient with prior history of irregular menses/oligomenorrhea. UPT negative today. Would like to have more regular menstrual cycles as she may be planning on conception within the next 6 months to 1 year.  Discussed that if she was not planning on conception immediately, she had option of using hromonal contraceptive such as OCPs, or could resume the Provera challenges q 2-3 months. Patient notes that she has not had good experiences with birth control in the past, would prefer to do the Provera challenge.  Will prescribe.

## 2020-06-08 DIAGNOSIS — Z8742 Personal history of other diseases of the female genital tract: Secondary | ICD-10-CM

## 2020-06-08 DIAGNOSIS — N970 Female infertility associated with anovulation: Secondary | ICD-10-CM

## 2020-06-09 MED ORDER — MEDROXYPROGESTERONE ACETATE 10 MG PO TABS: 10.0000 mg | ORAL_TABLET | Freq: Every day | ORAL | 4 refills | Status: DC

## 2020-06-09 MED ORDER — LETROZOLE 2.5 MG PO TABS: 2.5000 mg | ORAL_TABLET | Freq: Every day | ORAL | 2 refills | Status: DC

## 2020-06-09 NOTE — Addendum Note (Signed)
Addended by: Fabian November on: 06/09/2020 04:04 PM   Modules accepted: Orders

## 2020-10-12 ENCOUNTER — Encounter: Payer: Managed Care, Other (non HMO) | Admitting: Obstetrics and Gynecology

## 2020-10-12 ENCOUNTER — Encounter: Payer: Self-pay | Admitting: Obstetrics and Gynecology

## 2020-10-12 ENCOUNTER — Other Ambulatory Visit: Payer: Self-pay

## 2020-10-12 ENCOUNTER — Ambulatory Visit: Payer: Managed Care, Other (non HMO) | Admitting: Obstetrics and Gynecology

## 2020-10-12 VITALS — BP 119/81 | HR 98 | Ht 63.0 in | Wt 202.9 lb

## 2020-10-12 DIAGNOSIS — Z319 Encounter for procreative management, unspecified: Secondary | ICD-10-CM | POA: Diagnosis not present

## 2020-10-12 DIAGNOSIS — E282 Polycystic ovarian syndrome: Secondary | ICD-10-CM

## 2020-10-12 MED ORDER — LETROZOLE 2.5 MG PO TABS: 2.5000 mg | ORAL_TABLET | Freq: Every day | ORAL | 2 refills | Status: DC

## 2020-10-12 NOTE — Progress Notes (Signed)
Pt present for follow up for infertility. Pt stated that she has stopped taking the Provera due to her cycles are regular now. Pt stated that she was doing well no problems.

## 2020-10-12 NOTE — Progress Notes (Signed)
    GYNECOLOGY PROGRESS NOTE  Subjective:    Patient ID: Kristina Rubio, female    DOB: 1991/11/14, 29 y.o.   MRN: [id number]  HPI  Patient is a 29 y.o. G27P1001 female who presents for follow up of infertility.  She has been using Femara 2.5 mg dosing for the past 3 months.  She took Provera for the first month to regulate her cycles, notes that she has not had to use it after the first month. She is compliant with taking her Metformin 500 mg BID.  Is tracking her cycles on her phone app and having sex during the fertile window.   The following portions of the patient's history were reviewed and updated as appropriate: allergies, current medications, past family history, past medical history, past social history, past surgical history and problem list.  Review of Systems Pertinent items noted in HPI and remainder of comprehensive ROS otherwise negative.   Objective:   Blood pressure 119/81, pulse 98, height _0  (1.6 m), weight 202 lb 14.4 oz (92 kg), last menstrual period 09/30/2020, unknown if currently breastfeeding. General appearance: alert and no distress Remainder of exam deferred.    Assessment:   Infertility PCOS  Plan:   - Continue Femara at this time, will increase dosing to 5 mg for 3 months. Menses regular at this time.  - Advised on use of ovulation kit or Day 21 progesterone to ensure ovulation on Femara.  - Continue Metformin at current dosing.  - Follow up in 3 months, or sooner if pregnancy occurs.    [name], MD [facility]

## 2020-11-05 ENCOUNTER — Other Ambulatory Visit
Admission: RE | Admit: 2020-11-05 | Discharge: 2020-11-05 | Disposition: A | Discharge status: Home / Self Care | Payer: Managed Care, Other (non HMO) | Source: Ambulatory Visit | Attending: Internal Medicine | Admitting: Internal Medicine

## 2020-11-05 DIAGNOSIS — M79604 Pain in right leg: Secondary | ICD-10-CM | POA: Insufficient documentation

## 2020-11-05 LAB — FIBRIN DERIVATIVES D-DIMER (ARMC ONLY): Fibrin derivatives D-dimer (ARMC): 393.5 ng/mL (FEU) (ref 0.00–499.00)

## 2021-08-01 ENCOUNTER — Other Ambulatory Visit: Payer: Self-pay | Admitting: Obstetrics and Gynecology

## 2021-08-01 MED ORDER — LETROZOLE 2.5 MG PO TABS: 2.5000 mg | ORAL_TABLET | Freq: Every day | ORAL | 1 refills | Status: DC

## 2021-08-10 ENCOUNTER — Other Ambulatory Visit: Payer: Self-pay

## 2021-08-10 ENCOUNTER — Encounter: Payer: Self-pay | Admitting: Obstetrics and Gynecology

## 2021-08-10 ENCOUNTER — Ambulatory Visit (INDEPENDENT_AMBULATORY_CARE_PROVIDER_SITE_OTHER): Payer: 59 | Admitting: Obstetrics and Gynecology

## 2021-08-10 VITALS — BP 132/84 | HR 84 | Ht 63.0 in | Wt 197.2 lb

## 2021-08-10 DIAGNOSIS — Z319 Encounter for procreative management, unspecified: Secondary | ICD-10-CM

## 2021-08-10 DIAGNOSIS — E282 Polycystic ovarian syndrome: Secondary | ICD-10-CM

## 2021-08-10 DIAGNOSIS — Z8742 Personal history of other diseases of the female genital tract: Secondary | ICD-10-CM | POA: Diagnosis not present

## 2021-08-10 MED ORDER — MEDROXYPROGESTERONE ACETATE 10 MG PO TABS: 10.0000 mg | ORAL_TABLET | Freq: Every day | ORAL | 2 refills | Status: DC

## 2021-08-10 MED ORDER — LETROZOLE 2.5 MG PO TABS: 5.0000 mg | ORAL_TABLET | Freq: Every day | ORAL | 1 refills | Status: AC

## 2021-08-10 NOTE — Progress Notes (Signed)
    GYNECOLOGY PROGRESS NOTE  Subjective:    Patient ID: Kristina Rubio, female    DOB: [birth year], 30 y.o.   MRN: [id number]  HPI  Patient is a 30 y.o. G89P1001 female with a history of PCOS who presents for fertility management.  She was last seen in November of last year for fertility management.  At that time she was initiated on Femara 2.5 mg.  Patient notes that she took it for approximately 3 months.  However since pregnancy did not occur she took a break.  He is ready to resume.  Does report the periods have been irregular however recently took a dose of previously prescribed Provera and induced a cycle in late August.  Patient's last menstrual period was 08/01/2021 (exact date).  [name] reports that she has been working on weight loss.  She has lost 13 pounds so far.  Continue to take her metformin as prescribed.  The following portions of the patient's history were reviewed and updated as appropriate: allergies, current medications, past family history, past medical history, past social history, past surgical history, and problem list.   Review of Systems Pertinent items noted in HPI and remainder of comprehensive ROS otherwise negative.   Objective:   Blood pressure 132/84, pulse 84, height 5\' 3"  (1.6 m), weight 197 lb 3.2 oz (89.4 kg), last menstrual period 08/01/2021, not currently breastfeeding.  Body mass index is 34.93 kg/m. General appearance: alert and no distress Remainder of exam deferred.    Assessment:   1. Infertility management   2. PCOS (polycystic ovarian syndrome)   3. History of irregular menstrual cycles      Plan:   1. Infertility management -Patient has had trial of Clomid in the past.  Also trialed low dose of Femara months ago.  We will increase dosing to 5 mg per cycle for 2 cycles and then can increase again to 7.5 mg if needed.  Patient to begin medication on day 3 of each cycle starting next month.  Also reminded of timed coitus  intervals.  Can utilize ovulation kits at home.  Also encouraged to begin prenatal vitamins.  2. PCOS (polycystic ovarian syndrome) -Patient encouraged to continue her metformin for PCOS.  Can also help with fertility management and weight loss.   3. History of irregular menstrual cycles -Patient utilize Provera for induction of her menstrual cycle.  Has run out of medication.  Desires refill.  Will prescribe.   Patient to follow-up in 3 months if no pregnancy has occurred.   08/03/2021, MD [facility]

## 2021-08-10 NOTE — Patient Instructions (Signed)
Female Infertility Female infertility refers to a woman's inability to get pregnant (conceive) after a year of having sex regularly (or after 6 months in women over age 29) without using birth control. Infertility can also mean that a woman is not able to carry a pregnancy to full term. Both women and men can experience fertility problems. What are the causes? This condition may be caused by: Problems with reproductive organs. Infertility can result if a woman: Is not ovulating or is ovulating irregularly. Has a blockage or scarring in the fallopian tubes. Has uterine fibroids. This is a benign mass of tissue or muscle (tumor) that can develop in the uterus. Has an abnormally shaped uterus. Has an abnormally short cervix or a cervix that does not remain closed during a pregnancy. Certain medical conditions. These may include: Polycystic ovary syndrome (PCOS). This is a hormonal disorder that can cause small cysts to grow on the ovaries. This is the most common cause of infertility in women. Endometriosis. This is a condition in which the tissue that lines the uterus (endometrium) grows outside of its normal location. Cancer and cancer treatments, such as chemotherapy or radiation. Premature ovarian failure. This is when ovaries stop producing eggs and hormones before age 40. Sexually transmitted infections, such as chlamydia or gonorrhea. Autoimmune disorders. These are disorders in which the body's defense system (immune system) attacks normal, healthy cells. Infertility can be linked to more than one cause. For some women, the cause of infertility is not known (unexplained infertility). What increases the risk? The following factors may make you more likely to develop this condition: Age. A woman's fertility declines with age, especially after her mid-30s. Stress. Smoking. Being underweight or overweight. Drinking too much alcohol. Using drugs such as anabolic steroids, cocaine, and  marijuana. Exercising excessively. What are the signs or symptoms? The main sign of infertility in women is the inability to get pregnant or carry a pregnancy to full term. How is this diagnosed? This condition may be diagnosed by: Checking whether you are ovulating each month. The tests may include: Blood tests to check hormone levels. An ultrasound of the ovaries. Taking a sample of the tissue that lines the uterus and checking it under a microscope (endometrial biopsy). Doing additional tests. This is done if ovulation is normal. Tests may include: Hysterosalpingogram. This X-ray test can show the shape of the uterus and whether the fallopian tubes are open. Laparoscopy. This test uses a lighted tube (laparoscope) to look for problems in the fallopian tubes and other organs. Transvaginal ultrasound. This imaging test is used to check for abnormalities in the uterus and ovaries. Hysteroscopy. This test uses a lighted tube to check for problems in the cervix and the uterus. To be diagnosed with infertility, both partners will have a physical exam. Both partners will also have an extensive medical and sexual history taken. Additional tests may be done. How is this treated? Treatment depends on the cause of infertility. Most cases of infertility in women are treated with medicine or surgery. Women may take medicine to: Correct ovulation problems. Treat other health conditions. Surgery may be done to: Repair damage to the ovaries, fallopian tubes, cervix, or uterus. Remove growths from the uterus. Remove scar tissue from the uterus, pelvis, or other organs. Assisted reproductive technology (ART) Assisted reproductive technology (ART) refers to all treatments and procedures that combine eggs and sperm outside the body to try to help a couple conceive. ART is often combined with fertility drugs to stimulate ovulation.   Sometimes ART is done using eggs retrieved from another woman's body (donor  eggs) or from previously frozen fertilized eggs (embryos). There are different types of ART. These include: Intrauterine insemination (IUI). A long, thin tube is used to place sperm directly into a woman's uterus. This procedure: Is effective for infertility caused by sperm problems, including low sperm count and low motility. Can be used in combination with fertility drugs. In vitro fertilization (IVF). This is done when a woman's fallopian tubes are blocked or when a man has low sperm count. In this procedure: Fertility drugs are used to stimulate the ovaries to produce multiple eggs. Once mature, these eggs are removed from the body and combined with the sperm to be fertilized. The fertilized eggs are then placed into the woman's uterus. Follow these instructions at home: Lifestyle If you drink alcohol, limit how much you have to 0-1 drink a day. Do not use any products that contain nicotine or tobacco. These products include cigarettes, chewing tobacco, and vaping devices, such as e-cigarettes. If you need help quitting, ask your health care provider. Practice stress reduction techniques that work well for you, such as regular physical activity, meditation, or deep breathing. Make dietary changes to lose weight or maintain a healthy weight. Work with your health care provider and a dietitian to set a weight-loss goal that is healthy and reasonable for you. General instructions Take over-the-counter and prescription medicines only as told by your health care provider. Seek support from a counselor or support group to talk about your concerns related to infertility. Couples counseling may be helpful for you and your partner. Keep all follow-up visits. This is important. Where to find support Resolve - The National Infertility Association: resolve.org Contact a health care provider if: You feel that stress is interfering with your life and relationships. You have side effects from treatments  for infertility. Summary Female infertility refers to a woman's inability to get pregnant (conceive) after a year of having sex regularly (or after 6 months in women over age 29) without using birth control. To be diagnosed with infertility, both partners will have a physical exam. Both partners will also have an extensive medical and sexual history taken. Seek support from a counselor or support group to talk about your concerns related to infertility. Couples counseling may be helpful for you and your partner. This information is not intended to replace advice given to you by your health care provider. Make sure you discuss any questions you have with your health care provider. Document Revised: 01/17/2021 Document Reviewed: 01/17/2021 Elsevier Patient Education  2022 [employer]vier Inc.  

## 2021-08-26 ENCOUNTER — Other Ambulatory Visit: Payer: Self-pay | Admitting: Obstetrics and Gynecology

## 2021-09-08 NOTE — Progress Notes (Signed)
New Patient Office Visit  Subjective:  Patient ID: Kristina Rubio, female    DOB: May 29, 1991  Age: 30 y.o. MRN: [id number]  CC:  Chief Complaint  Patient presents with   New Patient (Initial Visit)    To est. Care. Took a home preg. Test, came back postitive, would like to have checked here.     HPI Kristina Rubio presents for new patient visit to establish care.  Introduced to Publishing rights manager role and practice setting.  All questions answered.  Discussed provider/patient relationship and expectations.  MISSED PERIOD  Her last menstrual period was on August 01, 2021. She had started taking fermera for infertility and is trying to get pregnant. Last time, it took over a year. She had a positive pregnancy test on October 1. She has an appointment scheduled with her OB in 2 weeks.   ANXIETY/STRESS  Duration:uncontrolled Anxious mood: yes  Excessive worrying: yes Irritability: no  Sweating: no Nausea: no Palpitations:no Hyperventilation: no Panic attacks: no Agoraphobia: no  Obscessions/compulsions: yes Depressed mood: no Anhedonia: no Weight changes: yes - has been trying to lose weight Insomnia: no   Hypersomnia: no Fatigue/loss of energy: yes Feelings of worthlessness: no Feelings of guilt: no Impaired concentration/indecisiveness: yes Suicidal ideations: no  Crying spells: no Recent Stressors/Life Changes: no   Relationship problems: no   Family stress: no     Financial stress: no    Job stress: no    Recent death/loss: no  Depression screen PHQ 2/9 09/11/2021  Decreased Interest 0  Down, Depressed, Hopeless 0  PHQ - 2 Score 0  Altered sleeping 0  Tired, decreased energy 0  Change in appetite 0  Feeling bad or failure about yourself  0  Trouble concentrating 0  Moving slowly or fidgety/restless 0  Suicidal thoughts 0  PHQ-9 Score 0  Difficult doing work/chores Not difficult at all    GAD 7 : Generalized Anxiety Score 09/11/2021   Nervous, Anxious, on Edge 0  Control/stop worrying 3  Worry too much - different things 3  Trouble relaxing 3  Restless 2  Easily annoyed or irritable 0  Afraid - awful might happen 1  Total GAD 7 Score 12  Anxiety Difficulty Somewhat difficult    Past Medical History:  Diagnosis Date   PCOS (polycystic ovarian syndrome)     Past Surgical History:  Procedure Laterality Date   NO PAST SURGERIES      Family History  Problem Relation Age of Onset   Anemia Mother    Healthy Father    Colon cancer Maternal Grandmother    Breast cancer Neg Hx     Social History   Socioeconomic History   Marital status: Married    Spouse name: Not on file   Number of children: 1   Years of education: Not on file   Highest education level: Not on file  Occupational History   Not on file  Tobacco Use   Smoking status: Never   Smokeless tobacco: Never  Vaping Use   Vaping Use: Never used  Substance and Sexual Activity   Alcohol use: Not Currently    Alcohol/week: 1.0 standard drink    Types: 1 Standard drinks or equivalent per week    Comment: occassional   Drug use: No   Sexual activity: Yes    Birth control/protection: None  Other Topics Concern   Not on file  Social History Narrative   Not on file   Social Determinants of Health  Financial Resource Strain: Not on file  Food Insecurity: Not on file  Transportation Needs: Not on file  Physical Activity: Not on file  Stress: Not on file  Social Connections: Not on file  Intimate Partner Violence: Not on file    ROS Review of Systems  Constitutional:  Positive for fatigue.  HENT: Negative.    Eyes: Negative.   Respiratory: Negative.    Cardiovascular: Negative.   Gastrointestinal: Negative.   Genitourinary:        Missed period  Musculoskeletal: Negative.   Skin: Negative.   Neurological: Negative.   Psychiatric/Behavioral:  The patient is nervous/anxious.    Objective:   Today's Vitals: BP 115/77   Pulse  91   Temp 100 F (37.8 C) (Oral)   Ht 5' 3.43" (1.611 m)   Wt 195 lb 3.2 oz (88.5 kg)   SpO2 99%   BMI 34.12 kg/m   Physical Exam Vitals and nursing note reviewed.  Constitutional:      General: She is not in acute distress.    Appearance: Normal appearance.  HENT:     Head: Normocephalic and atraumatic.     Right Ear: Tympanic membrane, ear canal and external ear normal.     Left Ear: Tympanic membrane, ear canal and external ear normal.     Nose: Nose normal.     Mouth/Throat:     Mouth: Mucous membranes are moist.     Pharynx: Oropharynx is clear.  Eyes:     Conjunctiva/sclera: Conjunctivae normal.  Cardiovascular:     Rate and Rhythm: Normal rate and regular rhythm.     Pulses: Normal pulses.     Heart sounds: Normal heart sounds.  Pulmonary:     Effort: Pulmonary effort is normal.     Breath sounds: Normal breath sounds.  Abdominal:     General: Bowel sounds are normal.     Palpations: Abdomen is soft.     Tenderness: There is no abdominal tenderness.  Musculoskeletal:        General: Normal range of motion.     Cervical back: Normal range of motion and neck supple. No tenderness.  Lymphadenopathy:     Cervical: No cervical adenopathy.  Skin:    General: Skin is warm and dry.  Neurological:     General: No focal deficit present.     Mental Status: She is alert and oriented to person, place, and time.     Cranial Nerves: No cranial nerve deficit.     Coordination: Coordination normal.     Gait: Gait normal.  Psychiatric:        Mood and Affect: Mood normal.        Behavior: Behavior normal.        Thought Content: Thought content normal.        Judgment: Judgment normal.    Assessment & Plan:   Problem List Items Addressed This Visit       Other   Generalized anxiety disorder    GAD 7 score is a 12 today, PHQ 9 is a 0. Discussed relaxation techniques including exercise, meditating, listening to music. Will hold off starting on medication today due to  positive pregnancy test. She can discuss her anxiety with OB and determine if there is a safe medication to start during pregnancy. Follow-up if symptoms worsen or any concerns.       Other Visit Diagnoses     Amenorrhea    -  Primary   Taking fermera, had  positive pregnancy test at home. Positive test in office. Continue prenatal vitamin. Encouraged her to reach out to her OB   Relevant Orders   Pregnancy, urine   Encounter to establish care           Outpatient Encounter Medications as of 09/11/2021  Medication Sig   Prenatal Vit-Fe Fumarate-FA (MULTIVITAMIN-PRENATAL) 27-0.8 MG TABS tablet Take 1 tablet by mouth daily at 12 noon.   medroxyPROGESTERone (PROVERA) 10 MG tablet Take 1 tablet (10 mg total) by mouth daily. Use for ten days  if no cycle occurs (Patient not taking: Reported on 09/11/2021)   metFORMIN (GLUCOPHAGE) 500 MG tablet Take 1,000 mg by mouth at bedtime. (Patient not taking: Reported on 09/11/2021)   No facility-administered encounter medications on file as of 09/11/2021.    Follow-up: Return if symptoms worsen or fail to improve.   Gerre Scull, NP

## 2021-09-11 ENCOUNTER — Other Ambulatory Visit: Payer: Self-pay

## 2021-09-11 ENCOUNTER — Ambulatory Visit (INDEPENDENT_AMBULATORY_CARE_PROVIDER_SITE_OTHER): Payer: 59 | Admitting: Nurse Practitioner

## 2021-09-11 ENCOUNTER — Encounter: Payer: Self-pay | Admitting: Nurse Practitioner

## 2021-09-11 VITALS — BP 115/77 | HR 91 | Temp 100.0°F | Ht 63.43 in | Wt 195.2 lb

## 2021-09-11 DIAGNOSIS — Z7689 Persons encountering health services in other specified circumstances: Secondary | ICD-10-CM | POA: Diagnosis not present

## 2021-09-11 DIAGNOSIS — F411 Generalized anxiety disorder: Secondary | ICD-10-CM

## 2021-09-11 DIAGNOSIS — N912 Amenorrhea, unspecified: Secondary | ICD-10-CM

## 2021-09-11 LAB — PREGNANCY, URINE: Preg Test, Ur: POSITIVE — AB

## 2021-09-11 NOTE — Assessment & Plan Note (Signed)
GAD 7 score is a 12 today, PHQ 9 is a 0. Discussed relaxation techniques including exercise, meditating, listening to music. Will hold off starting on medication today due to positive pregnancy test. She can discuss her anxiety with OB and determine if there is a safe medication to start during pregnancy. Follow-up if symptoms worsen or any concerns.

## 2021-09-21 ENCOUNTER — Other Ambulatory Visit: Payer: Self-pay

## 2021-09-21 ENCOUNTER — Ambulatory Visit (INDEPENDENT_AMBULATORY_CARE_PROVIDER_SITE_OTHER): Payer: 59

## 2021-09-21 VITALS — BP 108/73 | HR 88 | Ht 63.43 in | Wt 195.6 lb

## 2021-09-21 DIAGNOSIS — Z3A01 Less than 8 weeks gestation of pregnancy: Secondary | ICD-10-CM | POA: Diagnosis not present

## 2021-09-21 DIAGNOSIS — Z113 Encounter for screening for infections with a predominantly sexual mode of transmission: Secondary | ICD-10-CM | POA: Diagnosis not present

## 2021-09-21 DIAGNOSIS — Z0283 Encounter for blood-alcohol and blood-drug test: Secondary | ICD-10-CM | POA: Diagnosis not present

## 2021-09-21 DIAGNOSIS — Z3481 Encounter for supervision of other normal pregnancy, first trimester: Secondary | ICD-10-CM | POA: Diagnosis not present

## 2021-09-21 DIAGNOSIS — R638 Other symptoms and signs concerning food and fluid intake: Secondary | ICD-10-CM

## 2021-09-21 NOTE — Patient Instructions (Signed)
WHAT OB PATIENTS CAN EXPECT  Confirmation of pregnancy and ultrasound ordered if medically indicated-[redacted] weeks gestation New OB (NOB) intake with nurse and New OB (NOB) labs- [redacted] weeks gestation New OB (NOB) physical examination with provider- 11/[redacted] weeks gestation Flu vaccine-[redacted] weeks gestation Anatomy scan-[redacted] weeks gestation Glucose tolerance test, blood work to test for anemia, T-dap vaccine-[redacted] weeks gestation Vaginal swabs/cultures-STD/Group B strep-[redacted] weeks gestation Appointments every 4 weeks until 28 weeks Every 2 weeks from 28 weeks until 36 weeks Weekly visits from 9 weeks until delivery  First Trimester of Pregnancy The first trimester of pregnancy starts on the first day of your last menstrual period until the end of week 12. This is also called months 1 through 3 of pregnancy. Body changes during your first trimester Your body goes through many changes during pregnancy. The changes usually return to normal after your baby is born. Physical changes You may gain or lose weight. Your breasts may grow larger and hurt. The area around your nipples may get darker. Dark spots or blotches may develop on your face. You may have changes in your hair. Health changes You may feel like you might vomit (nauseous), and you may vomit. You may have heartburn. You may have headaches. You may have trouble pooping (constipation). Your gums may bleed. Other changes You may get tired easily. You may pee (urinate) more often. Your menstrual periods will stop. You may not feel hungry. You may want to eat certain kinds of food. You may have changes in your emotions from day to day. You may have more dreams. Follow these instructions at home: Medicines Take over-the-counter and prescription medicines only as told by your doctor. Some medicines are not safe during pregnancy. Take a prenatal vitamin that contains at least 600 micrograms (mcg) of folic acid. Eating and drinking Eat healthy meals  that include: Fresh fruits and vegetables. Whole grains. Good sources of protein, such as meat, eggs, or tofu. Low-fat dairy products. Avoid raw meat and unpasteurized juice, milk, and cheese. If you feel like you may vomit, or you vomit: Eat 4 or 5 small meals a day instead of 3 large meals. Try eating a few soda crackers. Drink liquids between meals instead of during meals. You may need to take these actions to prevent or treat trouble pooping: Drink enough fluids to keep your pee (urine) pale yellow. Eat foods that are high in fiber. These include beans, whole grains, and fresh fruits and vegetables. Limit foods that are high in fat and sugar. These include fried or sweet foods. Activity Exercise only as told by your doctor. Most people can do their usual exercise routine during pregnancy. Stop exercising if you have cramps or pain in your lower belly (abdomen) or low back. Do not exercise if it is too hot or too humid, or if you are in a place of great height (high altitude). Avoid heavy lifting. If you choose to, you may have sex unless your doctor tells you not to. Relieving pain and discomfort Wear a good support bra if your breasts are sore. Rest with your legs raised (elevated) if you have leg cramps or low back pain. If you have bulging veins (varicose veins) in your legs: Wear support hose as told by your doctor. Raise your feet for 15 minutes, 3-4 times a day. Limit salt in your food. Safety Wear your seat belt at all times when you are in a car. Talk with your doctor if someone is hurting you or yelling  at you. Talk with your doctor if you are feeling sad or have thoughts of hurting yourself. Lifestyle Do not use hot tubs, steam rooms, or saunas. Do not douche. Do not use tampons or scented sanitary pads. Do not use herbal medicines, illegal drugs, or medicines that are not approved by your doctor. Do not drink alcohol. Do not smoke or use any products that contain  nicotine or tobacco. If you need help quitting, ask your doctor. Avoid cat litter boxes and soil that is used by cats. These carry germs that can cause harm to the baby and can cause a loss of your baby by miscarriage or stillbirth. General instructions Keep all follow-up visits. This is important. Ask for help if you need counseling or if you need help with nutrition. Your doctor can give you advice or tell you where to go for help. Visit your dentist. At home, brush your teeth with a soft toothbrush. Floss gently. Write down your questions. Take them to your prenatal visits. Where to find more information American Pregnancy Association: americanpregnancy.org [employer]: www.acog.org Office on Women's Health: KeywordPortfolios.com.br Contact a doctor if: You are dizzy. You have a fever. You have mild cramps or pressure in your lower belly. You have a nagging pain in your belly area. You continue to feel like you may vomit, you vomit, or you have watery poop (diarrhea) for 24 hours or longer. You have a bad-smelling fluid coming from your vagina. You have pain when you pee. You are exposed to a disease that spreads from person to person, such as chickenpox, measles, Zika virus, HIV, or hepatitis. Get help right away if: You have spotting or bleeding from your vagina. You have very bad belly cramping or pain. You have shortness of breath or chest pain. You have any kind of injury, such as from a fall or a car crash. You have new or increased pain, swelling, or redness in an arm or leg. Summary The first trimester of pregnancy starts on the first day of your last menstrual period until the end of week 12 (months 1 through 3). Eat 4 or 5 small meals a day instead of 3 large meals. Do not smoke or use any products that contain nicotine or tobacco. If you need help quitting, ask your doctor. Keep all follow-up visits. This information is not  intended to replace advice given to you by your health care provider. Make sure you discuss any questions you have with your health care provider. Document Revised: 04/27/2020 Document Reviewed: 03/03/2020 Elsevier Patient Education  [place]. Morning Sickness Morning sickness is when you feel like you may vomit (feel nauseous) during pregnancy. Sometimes, you may vomit. Morning sickness most often happens in the morning, but it can also happen at any time of the day. Some women may have morning sickness that makes them vomit all the time. This is a more serious problem that needs treatment. What are the causes? The cause of this condition is not known. What increases the risk? You had vomiting or a feeling like you may vomit before your pregnancy. You had morning sickness in another pregnancy. You are pregnant with more than one baby, such as twins. What are the signs or symptoms? Feeling like you may vomit. Vomiting. How is this treated? Treatment is usually not needed for this condition. You may only need to change what you eat. In some cases, your doctor may give you some things to take for your  condition. These include: Vitamin B6 supplements. Medicines to treat the feeling that you may vomit. Ginger. Follow these instructions at home: Medicines Take over-the-counter and prescription medicines only as told by your doctor. Do not take any medicines until you talk with your doctor about them first. Take multivitamins before you get pregnant. These can stop or lessen the symptoms of morning sickness. Eating and drinking Eat dry toast or crackers before getting out of bed. Eat 5 or 6 small meals a day. Eat dry and bland foods like rice and baked potatoes. Do not eat greasy, fatty, or spicy foods. Have someone cook for you if the smell of food causes you to vomit or to feel like you may vomit. If you feel like you may vomit after taking prenatal vitamins, take them at night or  with a snack. Eat protein foods when you need a snack. Nuts, yogurt, and cheese are good choices. Drink fluids throughout the day. Try ginger ale made with real ginger, ginger tea made from fresh grated ginger, or ginger candies. General instructions Do not smoke or use any products that contain nicotine or tobacco. If you need help quitting, ask your doctor. Use an air purifier to keep the air in your house free of smells. Get lots of fresh air. Try to avoid smells that make you feel sick. Try wearing an acupressure wristband. This is a wristband that is used to treat seasickness. Try a treatment called acupuncture. In this treatment, a doctor puts needles into certain areas of your body to make you feel better. Contact a doctor if: You need medicine to feel better. You feel dizzy or light-headed. You are losing weight. Get help right away if: The feeling that you may vomit will not go away, or you cannot stop vomiting. You faint. You have very bad pain in your belly. Summary Morning sickness is when you feel like you may vomit (feel nauseous) during pregnancy. You may feel sick in the morning, but you can feel this way at any time of the day. Making some changes to what you eat may help your symptoms go away. This information is not intended to replace advice given to you by your health care provider. Make sure you discuss any questions you have with your health care provider. Document Revised: 07/04/2020 Document Reviewed: 06/13/2020 Elsevier Patient Education  2022 [employer]. How a Baby Grows During Pregnancy Pregnancy begins when a female's sperm enters a female's egg. This is called fertilization. Fertilization usually happens in one of the fallopian tubes that connect the ovaries to the uterus. The fertilized egg moves down the fallopian tube to the uterus. Once it reaches the uterus, it implants into the lining of the uterus and begins to grow. For the first 8 weeks, the  fertilized egg is called an embryo. After 8 weeks, it is called a fetus. As the fetus continues to grow, it receives oxygen and nutrients through the placenta, which is an organ that grows to support the developing baby. The placenta is the life support system for the baby. It provides oxygen and nutrition and removes waste. How long does a typical pregnancy last? A pregnancy usually lasts 280 days, or about 40 weeks. Pregnancy is divided into three periods of growth, also called trimesters: First trimester: 0-12 weeks. Second trimester: 13-27 weeks. Third trimester: 28-40 weeks. The day when your baby is ready to be born (full term) is your estimated date of delivery. However, most babies are not born on their  estimated date of delivery. How does my baby develop month by month? First month The fertilized egg attaches to the inside of the uterus. Some cells will form the placenta. Others will form the fetus. The arms, legs, brain, spinal cord, lungs, and heart begin to develop. At the end of the first month, the heart begins to beat. Second month The bones, inner ear, eyelids, hands, and feet form. The genitals develop. By the end of 8 weeks, all major organs are developing. Third month All of the internal organs are forming. Teeth develop below the gums. Bones and muscles begin to grow. The spine can flex. The skin is transparent. Fingernails and toenails begin to form. Arms and legs continue to grow longer, and hands and feet develop. The fetus is about 3 inches (7.6 cm) long. Fourth month The placenta is completely formed. The external sex organs, neck, outer ear, eyebrows, eyelids, and fingernails are formed. The fetus can hear, swallow, and move its arms and legs. The kidneys begin to produce urine. The skin is covered with a white, waxy coating (vernix) and very fine hair (lanugo). Fifth month The fetus moves around more and can be felt for the first time (quickening). The  fetus starts to sleep and wake up and may begin to suck a finger. The nails grow to the end of the fingers. The organ in the digestive system that makes bile (gallbladder) functions and helps to digest nutrients. If the fetus is a female, eggs are present in the ovaries. If the fetus is a female, testicles start to move down into the scrotum. Sixth month The lungs are formed. The eyes open. The brain continues to develop. Your baby has fingerprints and toe prints. Your baby's hair grows thicker. At the end of the second trimester, the fetus is about 9 inches (22.9 cm) long. Seventh month The fetus kicks and stretches. The eyes are developed enough to sense changes in light. The hands can make a grasping motion. The fetus responds to sound. Eighth month Most organs and body systems are fully developed and functioning. Bones harden, and taste buds develop. The fetus may hiccup. Certain areas of the brain are still developing. The skull remains soft. Ninth month The fetus gains about  lb (0.23 kg) each week. The lungs are fully developed. Patterns of sleep develop. The fetus's head typically moves into a head-down position (vertex) in the uterus to prepare for birth. The fetus weighs 6-9 lb (2.72-4.08 kg) and is 19-20 inches (48.26-50.8 cm) long. How do I know if my baby is developing well? Always talk with your health care provider about any concerns that you may have about your pregnancy and your baby. At each prenatal visit, your health care provider will do several different tests to check on your health and keep track of your baby's development. These include: Fundal height and position. To do this, your health care provider will: Measure your growing belly from your pubic bone to the top of the uterus using a tape measure. Feel your belly to determine your baby's position. Heartbeat. An ultrasound in the first trimester can confirm pregnancy and show a heartbeat, depending on how far  along you are. Your health care provider will check your baby's heart rate at every prenatal visit. You will also have a second trimester ultrasound to check your baby's development. Follow these instructions at home: Take prenatal vitamins as told by your health care provider. These include vitamins such as folic acid, iron, calcium, and  vitamin D. They are important for healthy development. Take over-the-counter and prescription medicines only as told by your health care provider. Keep all follow-up visits. This is important. Follow-up visits include prenatal care and screening tests. Summary A pregnancy usually lasts 280 days, or about 40 weeks. Pregnancy is divided into three periods of growth, also called trimesters. Your health care provider will monitor your baby's growth and development throughout your pregnancy. Follow your health care provider's recommendations about taking prenatal vitamins and medicines during your pregnancy. Talk with your health care provider if you have any concerns about your pregnancy or your developing baby. This information is not intended to replace advice given to you by your health care provider. Make sure you discuss any questions you have with your health care provider. Document Revised: 04/27/2020 Document Reviewed: 03/03/2020 Elsevier Patient Education  2022 [place]. Commonly Asked Questions During Pregnancy  Cats: A parasite can be excreted in cat feces.  To avoid exposure you need to have another person empty the little box.  If you must empty the litter box you will need to wear gloves.  Wash your hands after handling your cat.  This parasite can also be found in raw or undercooked meat so this should also be avoided.  Colds, Sore Throats, Flu: Please check your medication sheet to see what you can take for symptoms.  If your symptoms are unrelieved by these medications please call the office.  Dental Work: Most any dental work Investment banker, corporate  recommends is permitted.  X-rays should only be taken during the first trimester if absolutely necessary.  Your abdomen should be shielded with a lead apron during all x-rays.  Please notify your provider prior to receiving any x-rays.  Novocaine is fine; gas is not recommended.  If your dentist requires a note from [place] prior to dental work please call the office and we will provide one for you.  Exercise: Exercise is an important part of staying healthy during your pregnancy.  You may continue most exercises you were accustomed to prior to pregnancy.  Later in your pregnancy you will most likely notice you have difficulty with activities requiring balance like riding a bicycle.  It is important that you listen to your body and avoid activities that put you at a higher risk of falling.  Adequate rest and staying well hydrated are a must!  If you have questions about the safety of specific activities ask your provider.    Exposure to Children with illness: Try to avoid obvious exposure; report any symptoms to [place] when noted,  If you have chicken pos, red measles or mumps, you should be immune to these diseases.   Please do not take any vaccines while pregnant unless you have checked with your OB provider.  Fetal Movement: After 28 weeks we recommend you do "kick counts" twice daily.  Lie or sit down in a calm quiet environment and count your baby movements "kicks".  You should feel your baby at least 10 times per hour.  If you have not felt 10 kicks within the first hour get up, walk around and have something sweet to eat or drink then repeat for an additional hour.  If count remains less than 10 per hour notify your provider.  Fumigating: Follow your pest control agent's advice as to how long to stay out of your home.  Ventilate the area well before re-entering.  Hemorrhoids:   Most over-the-counter preparations can be used during pregnancy.  Check your  medication to see what is safe to use.  It is important  to use a stool softener or fiber in your diet and to drink lots of liquids.  If hemorrhoids seem to be getting worse please call the office.   Hot Tubs:  Hot tubs Jacuzzis and saunas are not recommended while pregnant.  These increase your internal body temperature and should be avoided.  Intercourse:  Sexual intercourse is safe during pregnancy as long as you are comfortable, unless otherwise advised by your provider.  Spotting may occur after intercourse; report any bright red bleeding that is heavier than spotting.  Labor:  If you know that you are in labor, please go to the hospital.  If you are unsure, please call the office and let us help you decide what to do.  Lifting, straining, etc:  If your job requires heavy lifting or straining please check with your provider for any limitations.  Generally, you should not lift items heavier than that you can lift simply with your hands and arms (no back muscles)  Painting:  Paint fumes do not harm your pregnancy, but may make you ill and should be avoided if possible.  Latex or water based paints have less odor than oils.  Use adequate ventilation while painting.  Permanents & Hair Color:  Chemicals in hair dyes are not recommended as they cause increase hair dryness which can increase hair loss during pregnancy.  " Highlighting" and permanents are allowed.  Dye may be absorbed differently and permanents may not hold as well during pregnancy.  Sunbathing:  Use a sunscreen, as skin burns easily during pregnancy.  Drink plenty of fluids; avoid over heating.  Tanning Beds:  Because their possible side effects are still unknown, tanning beds are not recommended.  Ultrasound Scans:  Routine ultrasounds are performed at approximately 20 weeks.  You will be able to see your baby's general anatomy an if you would like to know the gender this can usually be determined as well.  If it is questionable when you conceived you may also receive an ultrasound early  in your pregnancy for dating purposes.  Otherwise ultrasound exams are not routinely performed unless there is a medical necessity.  Although you can request a scan we ask that you pay for it when conducted because insurance does not cover " patient request" scans.  Work: If your pregnancy proceeds without complications you may work until your due date, unless your physician or employer advises otherwise.  Round Ligament Pain/Pelvic Discomfort:  Sharp, shooting pains not associated with bleeding are fairly common, usually occurring in the second trimester of pregnancy.  They tend to be worse when standing up or when you remain standing for long periods of time.  These are the result of pressure of certain pelvic ligaments called "round ligaments".  Rest, Tylenol and heat seem to be the most effective relief.  As the womb and fetus grow, they rise out of the pelvis and the discomfort improves.  Please notify the office if your pain seems different than that described.  It may represent a more serious condition.  Common Medications Safe in Pregnancy  Acne:      Constipation:  Benzoyl Peroxide     Colace  Clindamycin      Dulcolax Suppository  Topica Erythromycin     Fibercon  Salicylic Acid      Metamucil         Miralax AVOID:        Senakot  Accutane    Cough:  Retin-A       Cough Drops  Tetracycline      Phenergan w/ Codeine if Rx  Minocycline      Robitussin (Plain & DM)  Antibiotics:     Crabs/Lice:  Ceclor       RID  Cephalosporins    AVOID:  E-Mycins      Kwell  Keflex  Macrobid/Macrodantin   Diarrhea:  Penicillin      Kao-Pectate  Zithromax      Imodium AD         PUSH FLUIDS AVOID:       Cipro     Fever:  Tetracycline      Tylenol (Regular or Extra  Minocycline       Strength)  Levaquin      Extra Strength-Do not          Exceed 8 tabs/24 hrs Caffeine:        '200mg'$ /day (equiv. To 1 cup of coffee or  approx. 3 12 oz  sodas)         Gas: Cold/Hayfever:       Gas-X  Benadryl      Mylicon  Claritin       Phazyme  **Claritin-D        Chlor-Trimeton    Headaches:  Dimetapp      ASA-Free Excedrin  Drixoral-Non-Drowsy     Cold Compress  Mucinex (Guaifenasin)     Tylenol (Regular or Extra  Sudafed/Sudafed-12 Hour     Strength)  **Sudafed PE Pseudoephedrine   Tylenol Cold & Sinus     Vicks Vapor Rub  Zyrtec  **AVOID if Problems With Blood Pressure         Heartburn: Avoid lying down for at least 1 hour after meals  Aciphex      Maalox     Rash:  Milk of Magnesia     Benadryl    Mylanta       1% Hydrocortisone Cream  Pepcid  Pepcid Complete   Sleep Aids:  Prevacid      Ambien   Prilosec       Benadryl  Rolaids       Chamomile Tea  Tums (Limit 4/day)     Unisom         Tylenol PM         Warm milk-add vanilla or  Hemorrhoids:       Sugar for taste  Anusol/Anusol H.C.  (RX: Analapram 2.5%)  Sugar Substitutes:  Hydrocortisone OTC     Ok in moderation  Preparation H      Tucks        Vaseline lotion applied to tissue with wiping    Herpes:     Throat:  Acyclovir      Oragel  Famvir  Valtrex     Vaccines:         Flu Shot Leg Cramps:       *Gardasil  Benadryl      Hepatitis A         Hepatitis B Nasal Spray:       Pneumovax  Saline Nasal Spray     Polio Booster         Tetanus Nausea:       Tuberculosis test or PPD  Vitamin B6 25 mg TID   AVOID:    Dramamine      *Gardasil  Emetrol       Live Poliovirus  Ginger Root 250 mg QID    MMR (measles, mumps &  High Complex Carbs @ Bedtime    rebella)  Sea Bands-Accupressure    Varicella (Chickenpox)  Unisom 1/2 tab TID     *No known complications           If received before Pain:         Known pregnancy;   Darvocet       Resume series after  Lortab        Delivery  Percocet    Yeast:   Tramadol      Femstat  Tylenol 3      Gyne-lotrimin  Ultram       Monistat  Vicodin           MISC:         All Sunscreens           Hair  Coloring/highlights          Insect Repellant's          (Including DEET)         Mystic Tans

## 2021-09-21 NOTE — Progress Notes (Signed)
      Daralene Milch presents for NOB nurse intake visit. Pregnancy confirmation done at [facility], 09/11/2021, with Rodman Pickle, NP.  G2.  P 1001.  LMP 08/01/2021.  EDD 05/08/2022.  Ga [redacted]w[redacted]d . Pregnancy education material explained and given.  0 cats in the home.  NOB labs ordered. BMI greater than 30. TSH/HbgA1c ordered. Sickle cell order due to race. HIV and drug screen explained and ordered. Genetic screening discussed. Genetic testing; Pt under GA and will get genetic testing during her NOB PE with Provider. Pt to discuss genetic testing with provider. PNV encouraged. Pt to follow up with provider in 5 weeks for NOB physical and 1 week for ultrasound for dating and viability.  FMLA, HIV/Drug Screening forms and [facility] Financial Policy reviewed and signed by patient.

## 2021-09-22 LAB — URINALYSIS, ROUTINE W REFLEX MICROSCOPIC
Bilirubin, UA: NEGATIVE
Glucose, UA: NEGATIVE
Nitrite, UA: NEGATIVE
Protein,UA: NEGATIVE
RBC, UA: NEGATIVE
Specific Gravity, UA: 1.007 (ref 1.005–1.030)
Urobilinogen, Ur: 0.2 mg/dL (ref 0.2–1.0)
pH, UA: 6.5 (ref 5.0–7.5)

## 2021-09-22 LAB — DRUG PROFILE, UR, 9 DRUGS (LABCORP)
Amphetamines, Urine: NEGATIVE ng/mL
Barbiturate Quant, Ur: NEGATIVE ng/mL
Benzodiazepine Quant, Ur: NEGATIVE ng/mL
Cannabinoid Quant, Ur: NEGATIVE ng/mL
Cocaine (Metab.): NEGATIVE ng/mL
Methadone Screen, Urine: NEGATIVE ng/mL
Opiate Quant, Ur: NEGATIVE ng/mL
PCP Quant, Ur: NEGATIVE ng/mL
Propoxyphene: NEGATIVE ng/mL

## 2021-09-22 LAB — NICOTINE SCREEN, URINE: Cotinine Ql Scrn, Ur: NEGATIVE ng/mL

## 2021-09-22 LAB — MICROSCOPIC EXAMINATION
Bacteria, UA: NONE SEEN
Casts: NONE SEEN /lpf
RBC, Urine: NONE SEEN /hpf (ref 0–2)

## 2021-09-23 LAB — RPR: RPR Ser Ql: NONREACTIVE

## 2021-09-23 LAB — HIV ANTIBODY (ROUTINE TESTING W REFLEX): HIV Screen 4th Generation wRfx: NONREACTIVE

## 2021-09-23 LAB — HEMOGLOBIN A1C
Est. average glucose Bld gHb Est-mCnc: 100 mg/dL
Hgb A1c MFr Bld: 5.1 % (ref 4.8–5.6)

## 2021-09-23 LAB — RUBELLA SCREEN: Rubella Antibodies, IGG: 1.17 index (ref >0.99)

## 2021-09-23 LAB — VIRAL HEPATITIS HBV, HCV
HCV Ab: <0.1 s/co ratio (ref 0.0–0.9)
Hep B Core Total Ab: NEGATIVE
Hep B Surface Ab, Qual: REACTIVE
Hepatitis B Surface Ag: NEGATIVE

## 2021-09-23 LAB — VARICELLA ZOSTER ANTIBODY, IGG: Varicella zoster IgG: <135 index — ABNORMAL LOW (ref >165)

## 2021-09-23 LAB — TSH: TSH: 2.64 u[IU]/mL (ref 0.450–4.500)

## 2021-09-23 LAB — GC/CHLAMYDIA PROBE AMP
Chlamydia trachomatis, NAA: NEGATIVE
Neisseria Gonorrhoeae by PCR: NEGATIVE

## 2021-09-23 LAB — CULTURE, OB URINE

## 2021-09-23 LAB — HCV INTERPRETATION

## 2021-09-23 LAB — ABO AND RH: Rh Factor: POSITIVE

## 2021-09-23 LAB — HGB SOLU + RFLX FRAC: Sickle Solubility Test - HGBRFX: NEGATIVE

## 2021-09-23 LAB — URINE CULTURE, OB REFLEX

## 2021-09-23 LAB — ANTIBODY SCREEN: Antibody Screen: NEGATIVE

## 2021-09-26 ENCOUNTER — Other Ambulatory Visit: Payer: Self-pay | Admitting: Obstetrics and Gynecology

## 2021-09-26 ENCOUNTER — Other Ambulatory Visit: Payer: Self-pay

## 2021-09-26 ENCOUNTER — Ambulatory Visit (INDEPENDENT_AMBULATORY_CARE_PROVIDER_SITE_OTHER): Payer: 59

## 2021-09-26 DIAGNOSIS — Z3481 Encounter for supervision of other normal pregnancy, first trimester: Secondary | ICD-10-CM

## 2021-09-27 ENCOUNTER — Encounter: Payer: 59 | Admitting: Obstetrics and Gynecology

## 2021-10-24 ENCOUNTER — Encounter: Payer: Self-pay | Admitting: Internal Medicine

## 2021-10-24 ENCOUNTER — Telehealth: Payer: Self-pay | Admitting: Obstetrics and Gynecology

## 2021-10-24 ENCOUNTER — Ambulatory Visit: Payer: Self-pay

## 2021-10-24 ENCOUNTER — Telehealth (INDEPENDENT_AMBULATORY_CARE_PROVIDER_SITE_OTHER): Payer: 59 | Admitting: Internal Medicine

## 2021-10-24 DIAGNOSIS — J101 Influenza due to other identified influenza virus with other respiratory manifestations: Secondary | ICD-10-CM | POA: Diagnosis not present

## 2021-10-24 DIAGNOSIS — Z03818 Encounter for observation for suspected exposure to other biological agents ruled out: Secondary | ICD-10-CM | POA: Diagnosis not present

## 2021-10-24 DIAGNOSIS — J028 Acute pharyngitis due to other specified organisms: Secondary | ICD-10-CM | POA: Diagnosis not present

## 2021-10-24 DIAGNOSIS — R059 Cough, unspecified: Secondary | ICD-10-CM | POA: Diagnosis not present

## 2021-10-24 DIAGNOSIS — B9789 Other viral agents as the cause of diseases classified elsewhere: Secondary | ICD-10-CM | POA: Diagnosis not present

## 2021-10-24 MED ORDER — OSELTAMIVIR PHOSPHATE 75 MG PO CAPS: 75.0000 mg | ORAL_CAPSULE | Freq: Two times a day (BID) | ORAL | 0 refills | Status: AC

## 2021-10-24 NOTE — Telephone Encounter (Signed)
Pt who is [redacted] weeks pregnant called for advice on what medications she can take for her sx. Pt tested positive for flu this am. Sx began yesterday. Pt complains of dry cough with sporadic phlegm production. Pt unaware of the color of the phlegm. Pt stated that her throat and or nasopharynx feels like a burning sensation which is worse after coughing. Pt also c/o chills. Oral temp 98.2 during call.  Pt has had the flu vaccination.  Advised pt to call Dr Valentino Saxon and seek advice for what she may take while pregnant. Advised pt to follow OB recommendations. Advised pt that if Dr Charlotta Newton has any recommendations, she will receive a phone call. Care advice given and pt verbalized understanding.

## 2021-10-24 NOTE — Telephone Encounter (Signed)
Reason for Disposition  Patient is HIGH RISK (e.g., age > 64 years, pregnant, HIV+, or chronic medical condition)  Answer Assessment - Initial Assessment Questions 1. WORST SYMPTOM: "What is your worst symptom?" (e.g., cough, runny nose, muscle aches, headache, sore throat, fever)      Cough, chills 2. ONSET: "When did your flu symptoms start?"      Last night 3. COUGH: "How bad is the cough?"       Dry and throat burns occasionally 4. RESPIRATORY DISTRESS: "Describe your breathing."      normal 5. FEVER: "Do you have a fever?" If Yes, ask: "What is your temperature, how was it measured, and when did it start?"     During call: 98.2 oral  6. EXPOSURE: "Were you exposed to someone with influenza?"       Son sick last week was not tested for the flu 7. FLU VACCINE: "Did you get a flu shot this year?"     Yes  8. HIGH RISK DISEASE: "Do you have any chronic medical problems?" (e.g., heart or lung disease, asthma, weak immune system, or other HIGH RISK conditions)     pregnant 9. PREGNANCY: "Is there any chance you are pregnant?" "When was your last menstrual period?"     Yes- 10. OTHER SYMPTOMS: "Do you have any other symptoms?"  (e.g., runny nose, muscle aches, headache, sore throat)       Runny nose, chills and muscle aches and eyes sockets hurt and around eye sockets  Protocols used: Influenza - [facility]

## 2021-10-24 NOTE — Telephone Encounter (Signed)
Pt called stating that her PCP recommended that she call OBGYN make them aware she tested positive for Flu and ask if she needs to do anything "extra".

## 2021-10-24 NOTE — Telephone Encounter (Signed)
Pt aware tamiflu is safe in pregnancy,.

## 2021-10-24 NOTE — Progress Notes (Addendum)
LMP 08/01/2021 (Exact Date)    Subjective:    Patient ID: Kristina Rubio, female    DOB: 1991-04-25, 30 y.o.   MRN: [id number]  Chief Complaint  Patient presents with   Flu Positive    Tested positive today at work, patient is [redacted] weeks pregnant.     HPI: Kristina Rubio is a 30 y.o. female  This visit was completed via video visit through MyChart due to the restrictions of the COVID-19 pandemic. All issues as above were discussed and addressed. Physical exam was done as above through visual confirmation on video through MyChart. If it was felt that the patient should be evaluated in the office, they were directed there. The patient verbally consented to this visit. Location of the patient: home Location of the provider: work Those involved with this call:  Provider: Loura Pardon, MD CMA: Tristan Schroeder, CMA Front Desk/Registration: Channing Mutters  Time spent on call: 10 minutes with patient face to face via video conference. More than 50% of this time was spent in counseling and coordination of care. 10 minutes total spent in review of patient's record and preparation of their chart.      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 There has been no fever (99 F is her temperature today). Associated symptoms include congestion, sneezing and a sore throat. Pertinent negatives include no abdominal pain, chest pain, diarrhea, headaches, joint pain, joint swelling, nausea, neck pain, plugged ear sensation, rash, rhinorrhea, sinus pain, swollen glands, vomiting or wheezing.   Chief Complaint  Patient presents with   Flu Positive    Tested positive today at work, patient is [redacted] weeks pregnant.     Relevant past medical, surgical, family and social history reviewed and updated as indicated. Interim medical history since our last visit reviewed. Allergies and  medications reviewed and updated.  Review of Systems  HENT:  Positive for congestion, sneezing and sore throat. Negative for rhinorrhea and sinus pain.   Respiratory:  Negative for wheezing.   Cardiovascular:  Negative for chest pain.  Gastrointestinal:  Negative for abdominal pain, diarrhea, nausea and vomiting.  Musculoskeletal:  Negative for joint pain and neck pain.  Skin:  Negative for rash.  Neurological:  Negative for headaches.   Per HPI unless specifically indicated above     Objective:    LMP 08/01/2021 (Exact Date)   Wt Readings from Last 3 Encounters:  09/21/21 195 lb 9.6 oz (88.7 kg)  09/11/21 195 lb 3.2 oz (88.5 kg)  08/10/21 197 lb 3.2 oz (89.4 kg)    Physical Exam  Unable to peform sec to virtual visit.   Results for orders placed or performed in visit on 09/21/21  Culture, OB Urine   Specimen: Urine   UR  Result Value Ref Range   Urine Culture, OB Final report   GC/Chlamydia Probe Amp   Specimen: Urine   UR  Result Value Ref Range   Chlamydia trachomatis, NAA Negative Negative   Neisseria Gonorrhoeae by PCR Negative Negative  Microscopic Examination  Result Value Ref Range   WBC, UA 0-5 0 - 5 /hpf   RBC None seen 0 - 2 /hpf   Epithelial Cells (non renal) 0-10 0 - 10 /hpf   Casts None seen None seen /lpf   Bacteria, UA None seen None seen/Few  Urine Culture, OB Reflex  Result  Value Ref Range   Organism ID, Bacteria Comment   ABO AND RH   Result Value Ref Range   ABO Grouping O    Rh Factor Positive   Urine drug profile, 9 drugs (performed at [facility])  Result Value Ref Range   Amphetamines, Urine Negative Cutoff=1000 ng/mL   Barbiturate Quant, Ur Negative Cutoff=300 ng/mL   Benzodiazepine Quant, Ur Negative Cutoff=300 ng/mL   Cannabinoid Quant, Ur Negative Cutoff=50 ng/mL   Cocaine (Metab.) Negative Cutoff=300 ng/mL   Opiate Quant, Ur Negative Cutoff=300 ng/mL   PCP Quant, Ur Negative Cutoff=25 ng/mL   Methadone Screen, Urine Negative  Cutoff=300 ng/mL   Propoxyphene Negative Cutoff=300 ng/mL  Hemoglobin Solubility w/ Rflx to Frac  Result Value Ref Range   Sickle Solubility Test - HGBRFX Negative Negative  Viral Hepatitis HBV, HCV  Result Value Ref Range   Hepatitis B Surface Ag Negative Negative   Hep B Surface Ab, Qual Reactive    Hep B Core Total Ab Negative Negative   HCV Ab <0.1 0.0 - 0.9 s/co ratio  Varicella zoster antibody, IgG  Result Value Ref Range   Varicella zoster IgG <135 (L) Immune >165 index  Urinalysis, Routine w reflex microscopic  Result Value Ref Range   Specific Gravity, UA 1.007 1.005 - 1.030   pH, UA 6.5 5.0 - 7.5   Color, UA Yellow Yellow   Appearance Ur Clear Clear   Leukocytes,UA Trace (A) Negative   Protein,UA Negative Negative/Trace   Glucose, UA Negative Negative   Ketones, UA 1+ (A) Negative   RBC, UA Negative Negative   Bilirubin, UA Negative Negative   Urobilinogen, Ur 0.2 0.2 - 1.0 mg/dL   Nitrite, UA Negative Negative   Microscopic Examination See below:   Rubella screen  Result Value Ref Range   Rubella Antibodies, IGG 1.17 Immune >0.99 index  Nicotine screen, urine  Result Value Ref Range   Cotinine Ql Scrn, Ur Negative Cutoff=300 ng/mL   Drug Screen Comment: Comment   RPR  Result Value Ref Range   RPR Ser Ql Non Reactive Non Reactive  HIV Antibody (routine testing w rflx)  Result Value Ref Range   HIV Screen 4th Generation wRfx Non Reactive Non Reactive  Hemoglobin A1c  Result Value Ref Range   Hgb A1c MFr Bld 5.1 4.8 - 5.6 %   Est. 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   pt advised to take Tylenol q 4- 6 hourly as needed. If symptoms worsened will need to fu with us/ go to the ER if severe, pt will contact her obgyn as well if she noticed any issues. She is advised to increase water intake and take  adequate rest.  pt verbalised understanding of such.       Problem List Items Addressed This Visit   None    No orders of the defined types were placed in this encounter.    Meds ordered this encounter  Medications   oseltamivir (TAMIFLU) 75 MG capsule    Sig: Take 1 capsule (75 mg total) by mouth 2 (two) times daily for 5 days.    Dispense:  10 capsule    Refill:  0     Follow up plan: No follow-ups on file.

## 2021-10-31 ENCOUNTER — Other Ambulatory Visit (HOSPITAL_COMMUNITY)
Admission: RE | Admit: 2021-10-31 | Discharge: 2021-10-31 | Disposition: A | Discharge status: Home / Self Care | Payer: 59 | Source: Ambulatory Visit | Attending: Obstetrics and Gynecology | Admitting: Obstetrics and Gynecology

## 2021-10-31 ENCOUNTER — Encounter: Payer: Self-pay | Admitting: Obstetrics and Gynecology

## 2021-10-31 ENCOUNTER — Ambulatory Visit (INDEPENDENT_AMBULATORY_CARE_PROVIDER_SITE_OTHER): Payer: 59 | Admitting: Obstetrics and Gynecology

## 2021-10-31 ENCOUNTER — Other Ambulatory Visit: Payer: Self-pay

## 2021-10-31 VITALS — BP 121/81 | HR 84 | Ht 63.43 in | Wt 193.7 lb

## 2021-10-31 DIAGNOSIS — Z3481 Encounter for supervision of other normal pregnancy, first trimester: Secondary | ICD-10-CM

## 2021-10-31 DIAGNOSIS — Z124 Encounter for screening for malignant neoplasm of cervix: Secondary | ICD-10-CM

## 2021-10-31 DIAGNOSIS — O09899 Supervision of other high risk pregnancies, unspecified trimester: Secondary | ICD-10-CM

## 2021-10-31 DIAGNOSIS — Z2839 Other underimmunization status: Secondary | ICD-10-CM

## 2021-10-31 DIAGNOSIS — E282 Polycystic ovarian syndrome: Secondary | ICD-10-CM

## 2021-10-31 DIAGNOSIS — Z3A13 13 weeks gestation of pregnancy: Secondary | ICD-10-CM

## 2021-10-31 DIAGNOSIS — N970 Female infertility associated with anovulation: Secondary | ICD-10-CM

## 2021-10-31 DIAGNOSIS — R638 Other symptoms and signs concerning food and fluid intake: Secondary | ICD-10-CM

## 2021-10-31 LAB — POCT URINALYSIS DIPSTICK OB
Bilirubin, UA: NEGATIVE
Blood, UA: NEGATIVE
Glucose, UA: NEGATIVE
Ketones, UA: NEGATIVE
Nitrite, UA: NEGATIVE
Spec Grav, UA: 1.015 (ref 1.010–1.025)
Urobilinogen, UA: 0.2 E.U./dL
pH, UA: 7.5 (ref 5.0–8.0)

## 2021-10-31 NOTE — Progress Notes (Signed)
NOB Physical: She is doing well. She complains of cold hands and feet.

## 2021-10-31 NOTE — Progress Notes (Signed)
OBSTETRIC INITIAL PRENATAL VISIT  Subjective:    Kristina Rubio is being seen today for her first obstetrical visit.  This is a planned pregnancy, conceived with Clomid due to history of secondary infertility and PCOS. She is a 30 y.o. G2P1001 female at [redacted]w[redacted]d gestation, Estimated Date of Delivery: 05/08/22 with Patient's last menstrual period was 08/01/2021 (exact date). consistent with 7 week sono. Her obstetrical history is significant for mild obesity and PCOS . Relationship with FOB: significant other, living together. Patient does intend to breast feed. Pregnancy history fully reviewed.    OB History  Gravida Para Term Preterm AB Living  2 1 1  0 0 1  SAB IAB Ectopic Multiple Live Births  0 0 0 0 1    # Outcome Date GA Lbr Len/2nd Weight Sex Delivery Anes PTL Lv  2 Current           1 Term 10/24/18 [redacted]w[redacted]d / 01:53 6 lb 2.4 oz (2.79 kg) M Vag-Spont EPI  LIV     Name: Toves,BOY Keyarah     Apgar1: 7  Apgar5: 8    Gynecologic History:  Last pap smear was patient cannot recall.  Results were  Normal . Denies h/o abnormal pap smears in the past.  Denies history of STIs.  Contraception prior to conception: None   Past Medical History:  Diagnosis Date  . PCOS (polycystic ovarian syndrome)     Family History  Problem Relation Age of Onset  . Anemia Mother   . Healthy Father   . Colon cancer Maternal Grandmother   . Breast cancer Neg Hx     Past Surgical History:  Procedure Laterality Date  . NO PAST SURGERIES      Social History   Socioeconomic History  . Marital status: Married    Spouse name: Not on file  . Number of children: 1  . Years of education: Not on file  . Highest education level: Not on file  Occupational History  . Not on file  Tobacco Use  . Smoking status: Never  . Smokeless tobacco: Never  Vaping Use  . Vaping Use: Never used  Substance and Sexual Activity  . Alcohol use: Not Currently    Alcohol/week: 1.0 standard drink    Types:  1 Standard drinks or equivalent per week    Comment: occassional  . Drug use: No  . Sexual activity: Yes    Birth control/protection: None  Other Topics Concern  . Not on file  Social History Narrative  . Not on file   Social Determinants of Health   Financial Resource Strain: Not on file  Food Insecurity: Not on file  Transportation Needs: Not on file  Physical Activity: Not on file  Stress: Not on file  Social Connections: Not on file  Intimate Partner Violence: Not on file    Current Outpatient Medications on File Prior to Visit  Medication Sig Dispense Refill  . Prenatal Vit-Fe Fumarate-FA (MULTIVITAMIN-PRENATAL) 27-0.8 MG TABS tablet Take 1 tablet by mouth daily at 12 noon.     No current facility-administered medications on file prior to visit.    No Known Allergies   Review of Systems General: Not Present- Fever, Weight Loss and Weight Gain. Skin: Not Present- Rash.  Notes cold hands and feet since pregnancy.  HEENT: Not Present- Blurred Vision, Headache and Bleeding Gums. Respiratory: Not Present- Difficulty Breathing. Breast: Not Present- Breast Mass. Cardiovascular: Not Present- Chest Pain, Elevated Blood Pressure, Fainting / Blacking Out and Shortness  of Breath. Gastrointestinal: Not Present- Abdominal Pain, Constipation, Nausea and Vomiting. Female Genitourinary: Not Present- Frequency, Painful Urination, Pelvic Pain, Vaginal Bleeding, Vaginal Discharge, Contractions, regular, Fetal Movements Decreased, Urinary Complaints and Vaginal Fluid. Musculoskeletal: Not Present- Back Pain and Leg Cramps. Neurological: Not Present- Dizziness. Psychiatric: Not Present- Depression.     Objective:   Blood pressure 121/81, pulse 84, weight 193 lb 11.2 oz (87.9 kg), last menstrual period 08/01/2021.  Body mass index is 33.85 kg/m.  General Appearance:    Alert, cooperative, no distress, appears stated age  Head:    Normocephalic, without obvious abnormality, atraumatic   Eyes:    PERRL, conjunctiva/corneas clear, EOM's intact, both eyes  Ears:    Normal external ear canals, both ears  Nose:   Nares normal, septum midline, mucosa normal, no drainage or sinus tenderness  Throat:   Lips, mucosa, and tongue normal; teeth and gums normal  Neck:   Supple, symmetrical, trachea midline, no adenopathy; thyroid: no enlargement/tenderness/nodules; no carotid bruit or JVD  Back:     Symmetric, no curvature, ROM normal, no CVA tenderness  Lungs:     Clear to auscultation bilaterally, respirations unlabored  Chest Wall:    No tenderness or deformity   Heart:    Regular rate and rhythm, S1 and S2 normal, no murmur, rub or gallop  Breast Exam:    No tenderness, masses, or nipple abnormality  Abdomen:     Soft, non-tender, bowel sounds active all four quadrants, no masses, no organomegaly.  FHT 159  bpm.  Genitalia:    Pelvic:external genitalia normal, vagina without lesions, discharge, or tenderness, rectovaginal septum  normal. Cervix normal in appearance, no cervical motion tenderness, no adnexal masses or tenderness.  Pregnancy positive findings: uterine enlargement: 13 wk size, nontender.   Rectal:    Normal external sphincter.  No hemorrhoids appreciated. Internal exam not done.   Extremities:   Extremities normal, atraumatic, no cyanosis or edema  Pulses:   2+ and symmetric all extremities  Skin:   Skin color, texture, turgor normal, no rashes or lesions  Lymph nodes:   Cervical, supraclavicular, and axillary nodes normal  Neurologic:   CNII-XII intact, normal strength, sensation and reflexes throughout     Assessment:   1. Encounter for supervision of other normal pregnancy in first trimester   2. [redacted] weeks gestation of pregnancy   3. PCOS (polycystic ovarian syndrome)   4. Infertility associated with anovulation   5. Increased BMI   6. Maternal varicella, non-immune   7. Cervical cancer screening     Plan:   1. Supervision of normal other pregnancy -  Initial labs reviewed. TSH normal. Varicella non-immune. For vaccination postpartum.  - Prenatal vitamins encouraged. - Problem list reviewed and updated. - New OB counseling:  The patient has been given an overview regarding routine prenatal care.  - Prenatal testing, optional genetic testing, and ultrasound use in pregnancy were reviewed.  Traditional genetic screening vs cell-fee DNA genetic screening discussed, including risks and benefits. Testing Avelina Laine) ordered.  Declined carrier screening.  - Benefits of Breast Feeding were discussed. The patient is encouraged to consider nursing her baby post partum.  2. Cervical cancer screening - Cytology - PAP  3. PCOS - Discussed risk factors in pregnancy, including increased risk of diabetes and HTN in pregnancy. Will monitor.   4.  History of infertility due to ovulation - Conception from Clomid ovulation induction.    5. Obesity in pregnancy (Increased BMI)  Recommendations regarding diet, weight  gain, and exercise in pregnancy were given.  Follow up in 4 weeks.    Hildred Laser, MD [facility]

## 2021-10-31 NOTE — Patient Instructions (Signed)
WHAT OB PATIENTS CAN EXPECT ° °Confirmation of pregnancy and ultrasound ordered if medically indicated-[redacted] weeks gestation °New OB (NOB) intake with nurse and New OB (NOB) labs- [redacted] weeks gestation °New OB (NOB) physical examination with provider- 11/[redacted] weeks gestation °Flu vaccine-[redacted] weeks gestation °Anatomy scan-[redacted] weeks gestation °Glucose tolerance test, blood work to test for anemia, T-dap vaccine-[redacted] weeks gestation °Vaginal swabs/cultures-STD/Group B strep-[redacted] weeks gestation °Appointments every 4 weeks until 28 weeks °Every 2 weeks from 28 weeks until 36 weeks °Weekly visits from 36 weeks until delivery ° ° ° °Second Trimester of Pregnancy °The second trimester of pregnancy is from week 13 through week 27. This is months 4 through 6 of pregnancy. The second trimester is often a time when you feel your best. Your body has adjusted to being pregnant, and you begin to feel better physically. °During the second trimester: °Morning sickness has lessened or stopped completely. °You may have more energy. °You may have an increase in appetite. °The second trimester is also a time when the unborn baby (fetus) is growing rapidly. At the end of the sixth month, the fetus may be up to 12 inches long and weigh about 1½ pounds. You will likely begin to feel the baby move (quickening) between 16 and 20 weeks of pregnancy. °Body changes during your second trimester °Your body continues to go through many changes during your second trimester. The changes vary and generally return to normal after the baby is born. °Physical changes °Your weight will continue to increase. You will notice your lower abdomen bulging out. °You may begin to get stretch marks on your hips, abdomen, and breasts. °Your breasts will continue to grow and to become tender. °Dark spots or blotches (chloasma or mask of pregnancy) may develop on your face. °A dark line from your belly button to the pubic area (linea nigra) may appear. °You may have changes in  your hair. These can include thickening of your hair, rapid growth, and changes in texture. Some people also have hair loss during or after pregnancy, or hair that feels dry or thin. °Health changes °You may develop headaches. °You may have heartburn. °You may develop constipation. °You may develop hemorrhoids or swollen, bulging veins (varicose veins). °Your gums may bleed and may be sensitive to brushing and flossing. °You may urinate more often because the fetus is pressing on your bladder. °You may have back pain. This is caused by: °Weight gain. °Pregnancy hormones that are relaxing the joints in your pelvis. °A shift in weight and the muscles that support your balance. °Follow these instructions at home: °Medicines °Follow your health care provider's instructions regarding medicine use. Specific medicines may be either safe or unsafe to take during pregnancy. Do not take any medicines unless approved by your health care provider. °Take a prenatal vitamin that contains at least 600 micrograms (mcg) of folic acid. °Eating and drinking °Eat a healthy diet that includes fresh fruits and vegetables, whole grains, good sources of protein such as meat, eggs, or tofu, and low-fat dairy products. °Avoid raw meat and unpasteurized juice, milk, and cheese. These carry germs that can harm you and your baby. °You may need to take these actions to prevent or treat constipation: °Drink enough fluid to keep your urine pale yellow. °Eat foods that are high in fiber, such as beans, whole grains, and fresh fruits and vegetables. °Limit foods that are high in fat and processed sugars, such as fried or sweet foods. °Activity °Exercise only as directed by your   health care provider. Most people can continue their usual exercise routine during pregnancy. Try to exercise for 30 minutes at least 5 days a week. Stop exercising if you develop contractions in your uterus. °Stop exercising if you develop pain or cramping in the lower  abdomen or lower back. °Avoid exercising if it is very hot or humid or if you are at a high altitude. °Avoid heavy lifting. °If you choose to, you may have sex unless your health care provider tells you not to. °Relieving pain and discomfort °Wear a supportive bra to prevent discomfort from breast tenderness. °Take warm sitz baths to soothe any pain or discomfort caused by hemorrhoids. Use hemorrhoid cream if your health care provider approves. °Rest with your legs raised (elevated) if you have leg cramps or low back pain. °If you develop varicose veins: °Wear support hose as told by your health care provider. °Elevate your feet for 15 minutes, 3-4 times a day. °Limit salt in your diet. °Safety °Wear your seat belt at all times when driving or riding in a car. °Talk with your health care provider if someone is verbally or physically abusive to you. °Lifestyle °Do not use hot tubs, steam rooms, or saunas. °Do not douche. Do not use tampons or scented sanitary pads. °Avoid cat litter boxes and soil used by cats. These carry germs that can cause birth defects in the baby and possibly loss of the fetus by miscarriage or stillbirth. °Do not use herbal remedies, alcohol, illegal drugs, or medicines that are not approved by your health care provider. Chemicals in these products can harm your baby. °Do not use any products that contain nicotine or tobacco, such as cigarettes, e-cigarettes, and chewing tobacco. If you need help quitting, ask your health care provider. °General instructions °During a routine prenatal visit, your health care provider will do a physical exam and other tests. He or she will also discuss your overall health. Keep all follow-up visits. This is important. °Ask your health care provider for a referral to a local prenatal education class. °Ask for help if you have counseling or nutritional needs during pregnancy. Your health care provider can offer advice or refer you to specialists for help with  various needs. °Where to find more information °American Pregnancy Association: americanpregnancy.org °American College of Obstetricians and Gynecologists: acog.org/en/Womens%20Health/Pregnancy °Office on Women's Health: womenshealth.gov/pregnancy °Contact a health care provider if you have: °A headache that does not go away when you take medicine. °Vision changes or you see spots in front of your eyes. °Mild pelvic cramps, pelvic pressure, or nagging pain in the abdominal area. °Persistent nausea, vomiting, or diarrhea. °A bad-smelling vaginal discharge or foul-smelling urine. °Pain when you urinate. °Sudden or extreme swelling of your face, hands, ankles, feet, or legs. °A fever. °Get help right away if you: °Have fluid leaking from your vagina. °Have spotting or bleeding from your vagina. °Have severe abdominal cramping or pain. °Have difficulty breathing. °Have chest pain. °Have fainting spells. °Have not felt your baby move for the time period told by your health care provider. °Have new or increased pain, swelling, or redness in an arm or leg. °Summary °The second trimester of pregnancy is from week 13 through week 27 (months 4 through 6). °Do not use herbal remedies, alcohol, illegal drugs, or medicines that are not approved by your health care provider. Chemicals in these products can harm your baby. °Exercise only as directed by your health care provider. Most people can continue their usual exercise routine during   pregnancy. °Keep all follow-up visits. This is important. °This information is not intended to replace advice given to you by your health care provider. Make sure you discuss any questions you have with your health care provider. °Document Revised: 04/27/2020 Document Reviewed: 03/03/2020 °Elsevier Patient Education © 2022 [employer]. ° °

## 2021-11-01 LAB — CBC
Hematocrit: 35.9 % (ref 34.0–46.6)
Hemoglobin: 12.4 g/dL (ref 11.1–15.9)
MCH: 29.8 pg (ref 26.6–33.0)
MCHC: 34.5 g/dL (ref 31.5–35.7)
MCV: 86 fL (ref 79–97)
Platelets: 348 10*3/uL (ref 150–450)
RBC: 4.16 x10E6/uL (ref 3.77–5.28)
RDW: 13.4 % (ref 11.7–15.4)
WBC: 7.3 10*3/uL (ref 3.4–10.8)

## 2021-11-02 LAB — CYTOLOGY - PAP: Diagnosis: NEGATIVE

## 2021-11-17 ENCOUNTER — Encounter: Payer: Self-pay | Admitting: Obstetrics and Gynecology

## 2021-11-17 NOTE — Telephone Encounter (Signed)
Patient called. Patient aware of genetics. She will pick up results that reveal the gender at the front desk.

## 2021-11-28 ENCOUNTER — Ambulatory Visit (INDEPENDENT_AMBULATORY_CARE_PROVIDER_SITE_OTHER): Payer: 59 | Admitting: Obstetrics and Gynecology

## 2021-11-28 ENCOUNTER — Encounter: Payer: Self-pay | Admitting: Obstetrics and Gynecology

## 2021-11-28 ENCOUNTER — Other Ambulatory Visit: Payer: Self-pay

## 2021-11-28 VITALS — BP 113/74 | HR 98 | Wt 198.9 lb

## 2021-11-28 DIAGNOSIS — Z1379 Encounter for other screening for genetic and chromosomal anomalies: Secondary | ICD-10-CM | POA: Diagnosis not present

## 2021-11-28 DIAGNOSIS — Z3482 Encounter for supervision of other normal pregnancy, second trimester: Secondary | ICD-10-CM

## 2021-11-28 DIAGNOSIS — Z3A17 17 weeks gestation of pregnancy: Secondary | ICD-10-CM

## 2021-11-28 LAB — POCT URINALYSIS DIPSTICK OB
Bilirubin, UA: NEGATIVE
Blood, UA: NEGATIVE
Glucose, UA: NEGATIVE
Ketones, UA: NEGATIVE
Leukocytes, UA: NEGATIVE
Nitrite, UA: NEGATIVE
Spec Grav, UA: 1.02 (ref 1.010–1.025)
Urobilinogen, UA: 0.2 E.U./dL
pH, UA: 7 (ref 5.0–8.0)

## 2021-11-28 NOTE — Progress Notes (Signed)
ROB: She has no complaints today.  She is starting to feel better from her first trimester nausea and vomiting.  She desires AFP today.  Anatomy ultrasound ordered with next visit.

## 2021-11-28 NOTE — Progress Notes (Signed)
ROB: She is doing well, no new concerns. AFP ordered today.

## 2021-11-29 DIAGNOSIS — H9201 Otalgia, right ear: Secondary | ICD-10-CM | POA: Diagnosis not present

## 2021-11-29 DIAGNOSIS — J029 Acute pharyngitis, unspecified: Secondary | ICD-10-CM | POA: Diagnosis not present

## 2021-11-29 DIAGNOSIS — Z03818 Encounter for observation for suspected exposure to other biological agents ruled out: Secondary | ICD-10-CM | POA: Diagnosis not present

## 2021-11-29 DIAGNOSIS — R6889 Other general symptoms and signs: Secondary | ICD-10-CM | POA: Diagnosis not present

## 2021-12-03 NOTE — L&D Delivery Note (Signed)
Delivery Summary for Kristina Rubio  Labor Events:   Preterm labor: No data found  Rupture date: No data found  Rupture time: No data found  Rupture type: No data found  Fluid Color: No data found  Induction: No data found  Augmentation: No data found  Complications: No data found  Cervical ripening: No data found No data found   No data found     Delivery:   Episiotomy: No data found  Lacerations: No data found  Repair suture: No data found  Repair # of packets: No data found  Blood loss (ml): 500   Information for the patient's newborn:  Kristina Rubio, Kristina Rubio [[id number]]   Delivery 05/11/2022 12:44 PM by  Vaginal, Spontaneous Sex:  female Gestational Age: [redacted]w[redacted]d Delivery Clinician:   Living?:         APGARS  One minute Five minutes Ten minutes  Skin color:        Heart rate:        Grimace:        Muscle tone:        Breathing:        Totals: 9  10      Presentation/position:      Resuscitation:   Cord information:    Disposition of cord blood:     Blood gases sent?  Complications:   Placenta: Delivered:       appearance Newborn Measurements: Weight: 6 lb 14.4 oz (3130 g)  Height: 20.67"  Head circumference:    Chest circumference:    Other providers:    Additional  information: Forceps:   Vacuum:   Breech:   Observed anomalies       Delivery Note At 12:44 PM a viable and healthy female was delivered via Vaginal, Spontaneous (Presentation: Right Occiput Anterior, Compound hand).  APGAR: 9, 10; weight  3130 grams.   Placenta status: Spontaneous, Intact.  Cord: 3 vessels with the following complications: None.  Cord pH: not obtained.  Delayed cord clamping observed.   Anesthesia: Epidural Episiotomy: None Lacerations: Periurethral abrasions bilateral; hemostatic Suture Repair:  None Est. Blood Loss (mL): 500  Mom to postpartum.  Baby to Couplet care / Skin to Skin.  Hildred Laser, MD 05/11/2022, 1:03 PM

## 2021-12-05 LAB — AFP, SERUM, OPEN SPINA BIFIDA
AFP MoM: 0.59
AFP Value: 19.9 ng/mL
Gest. Age on Collection Date: 17 weeks
Maternal Age At EDD: 29.6 yr
OSBR Risk 1 IN: 10000
Test Results:: NEGATIVE
Weight: 199 [lb_av]

## 2021-12-20 ENCOUNTER — Ambulatory Visit (INDEPENDENT_AMBULATORY_CARE_PROVIDER_SITE_OTHER): Payer: 59

## 2021-12-20 ENCOUNTER — Other Ambulatory Visit: Payer: Self-pay

## 2021-12-20 DIAGNOSIS — Z3A17 17 weeks gestation of pregnancy: Secondary | ICD-10-CM

## 2021-12-20 DIAGNOSIS — Z3483 Encounter for supervision of other normal pregnancy, third trimester: Secondary | ICD-10-CM

## 2021-12-22 ENCOUNTER — Encounter: Payer: 59 | Admitting: Obstetrics and Gynecology

## 2021-12-22 ENCOUNTER — Ambulatory Visit (INDEPENDENT_AMBULATORY_CARE_PROVIDER_SITE_OTHER): Payer: 59 | Admitting: Obstetrics and Gynecology

## 2021-12-22 ENCOUNTER — Other Ambulatory Visit: Payer: Self-pay

## 2021-12-22 ENCOUNTER — Encounter: Payer: Self-pay | Admitting: Obstetrics and Gynecology

## 2021-12-22 VITALS — BP 118/68 | HR 96 | Wt 200.0 lb

## 2021-12-22 DIAGNOSIS — Z3A20 20 weeks gestation of pregnancy: Secondary | ICD-10-CM

## 2021-12-22 DIAGNOSIS — Z3482 Encounter for supervision of other normal pregnancy, second trimester: Secondary | ICD-10-CM

## 2021-12-22 LAB — POCT URINALYSIS DIPSTICK OB
Blood, UA: NEGATIVE
Glucose, UA: NEGATIVE
Leukocytes, UA: NEGATIVE
POC,PROTEIN,UA: NEGATIVE
Spec Grav, UA: 1.015 (ref 1.010–1.025)

## 2021-12-22 NOTE — Progress Notes (Signed)
ROB: Doing well, no complaints.  Discussed breastfeeding (see topics).  Normal but incomplete anatomy scan, to return in 2-3 weeks for completion.

## 2021-12-22 NOTE — Patient Instructions (Signed)
WHAT OB PATIENTS CAN EXPECT ° °Confirmation of pregnancy and ultrasound ordered if medically indicated-[redacted] weeks gestation °New OB (NOB) intake with nurse and New OB (NOB) labs- [redacted] weeks gestation °New OB (NOB) physical examination with provider- 11/[redacted] weeks gestation °Flu vaccine-[redacted] weeks gestation °Anatomy scan-[redacted] weeks gestation °Glucose tolerance test, blood work to test for anemia, T-dap vaccine-[redacted] weeks gestation °Vaginal swabs/cultures-STD/Group B strep-[redacted] weeks gestation °Appointments every 4 weeks until 28 weeks °Every 2 weeks from 28 weeks until 36 weeks °Weekly visits from 36 weeks until delivery ° ° ° ° °Second Trimester of Pregnancy °The second trimester of pregnancy is from week 13 through week 27. This is also called months 4 through 6 of pregnancy. This is often the time when you feel your best. °During the second trimester: °Morning sickness is less or has stopped. °You may have more energy. °You may feel hungry more often. °At this time, your unborn baby (fetus) is growing very fast. At the end of the sixth month, the unborn baby may be up to 12 inches long and weigh about 1½ pounds. You will likely start to feel the baby move between 16 and 20 weeks of pregnancy. °Body changes during your second trimester °Your body continues to go through many changes during this time. The changes vary and generally return to normal after the baby is born. °Physical changes °You will gain more weight. °You may start to get stretch marks on your hips, belly (abdomen), and breasts. °Your breasts will grow and may hurt. °Dark spots or blotches may develop on your face. °A dark line from your belly button to the pubic area (linea nigra) may appear. °You may have changes in your hair. °Health changes °You may have headaches. °You may have heartburn. °You may have trouble pooping (constipation). °You may have hemorrhoids or swollen, bulging veins (varicose veins). °Your gums may bleed. °You may pee (urinate) more  often. °You may have back pain. °Follow these instructions at home: °Medicines °Take over-the-counter and prescription medicines only as told by your doctor. Some medicines are not safe during pregnancy. °Take a prenatal vitamin that contains at least 600 micrograms (mcg) of folic acid. °Eating and drinking °Eat healthy meals that include: °Fresh fruits and vegetables. °Whole grains. °Good sources of protein, such as meat, eggs, or tofu. °Low-fat dairy products. °Avoid raw meat and unpasteurized juice, milk, and cheese. °You may need to take these actions to prevent or treat trouble pooping: °Drink enough fluids to keep your pee (urine) pale yellow. °Eat foods that are high in fiber. These include beans, whole grains, and fresh fruits and vegetables. °Limit foods that are high in fat and sugar. These include fried or sweet foods. °Activity °Exercise only as told by your doctor. Most people can do their usual exercise during pregnancy. Try to exercise for 30 minutes at least 5 days a week. °Stop exercising if you have pain or cramps in your belly or lower back. °Do not exercise if it is too hot or too humid, or if you are in a place of great height (high altitude). °Avoid heavy lifting. °If you choose to, you may have sex unless your doctor tells you not to. °Relieving pain and discomfort °Wear a good support bra if your breasts are sore. °Take warm water baths (sitz baths) to soothe pain or discomfort caused by hemorrhoids. Use hemorrhoid cream if your doctor approves. °Rest with your legs raised (elevated) if you have leg cramps or low back pain. °If you develop bulging   veins in your legs: °Wear support hose as told by your doctor. °Raise your feet for 15 minutes, 3-4 times a day. °Limit salt in your food. °Safety °Wear your seat belt at all times when you are in a car. °Talk with your doctor if someone is hurting you or yelling at you a lot. °Lifestyle °Do not use hot tubs, steam rooms, or saunas. °Do not douche.  Do not use tampons or scented sanitary pads. °Avoid cat litter boxes and soil used by cats. These carry germs that can harm your baby and can cause a loss of your baby by miscarriage or stillbirth. °Do not use herbal medicines, illegal drugs, or medicines that are not approved by your doctor. Do not drink alcohol. °Do not smoke or use any products that contain nicotine or tobacco. If you need help quitting, ask your doctor. °General instructions °Keep all follow-up visits. This is important. °Ask your doctor about local prenatal classes. °Ask your doctor about the right foods to eat or for help finding a counselor. °Where to find more information °American Pregnancy Association: americanpregnancy.org °American College of Obstetricians and Gynecologists: www.acog.org °Office on Women's Health: womenshealth.gov/pregnancy °Contact a doctor if: °You have a headache that does not go away when you take medicine. °You have changes in how you see, or you see spots in front of your eyes. °You have mild cramps, pressure, or pain in your lower belly. °You continue to feel like you may vomit (nauseous), you vomit, or you have watery poop (diarrhea). °You have bad-smelling fluid coming from your vagina. °You have pain when you pee or your pee smells bad. °You have very bad swelling of your face, hands, ankles, feet, or legs. °You have a fever. °Get help right away if: °You are leaking fluid from your vagina. °You have spotting or bleeding from your vagina. °You have very bad belly cramping or pain. °You have trouble breathing. °You have chest pain. °You faint. °You have not felt your baby move for the time period told by your doctor. °You have new or increased pain, swelling, or redness in an arm or leg. °Summary °The second trimester of pregnancy is from week 13 through week 27 (months 4 through 6). °Eat healthy meals. °Exercise as told by your doctor. Most people can do their usual exercise during pregnancy. °Do not use herbal  medicines, illegal drugs, or medicines that are not approved by your doctor. Do not drink alcohol. °Call your doctor if you get sick or if you notice anything unusual about your pregnancy. °This information is not intended to replace advice given to you by your health care provider. Make sure you discuss any questions you have with your health care provider. °Document Revised: 04/27/2020 Document Reviewed: 03/03/2020 °Elsevier Patient Education © 2022 Elsevier Inc. ° °

## 2021-12-26 ENCOUNTER — Encounter: Payer: 59 | Admitting: Obstetrics and Gynecology

## 2021-12-26 ENCOUNTER — Other Ambulatory Visit: Payer: 59

## 2022-01-10 ENCOUNTER — Other Ambulatory Visit: Payer: Self-pay | Admitting: Obstetrics and Gynecology

## 2022-01-10 ENCOUNTER — Other Ambulatory Visit: Payer: Self-pay

## 2022-01-10 ENCOUNTER — Ambulatory Visit (INDEPENDENT_AMBULATORY_CARE_PROVIDER_SITE_OTHER): Payer: 59

## 2022-01-10 DIAGNOSIS — Z3402 Encounter for supervision of normal first pregnancy, second trimester: Secondary | ICD-10-CM

## 2022-01-16 DIAGNOSIS — Z03818 Encounter for observation for suspected exposure to other biological agents ruled out: Secondary | ICD-10-CM | POA: Diagnosis not present

## 2022-01-24 ENCOUNTER — Encounter: Payer: Self-pay | Admitting: Obstetrics and Gynecology

## 2022-01-24 ENCOUNTER — Ambulatory Visit (INDEPENDENT_AMBULATORY_CARE_PROVIDER_SITE_OTHER): Payer: 59 | Admitting: Obstetrics and Gynecology

## 2022-01-24 ENCOUNTER — Other Ambulatory Visit: Payer: Self-pay

## 2022-01-24 VITALS — BP 110/67 | HR 95 | Wt 206.0 lb

## 2022-01-24 DIAGNOSIS — Z3482 Encounter for supervision of other normal pregnancy, second trimester: Secondary | ICD-10-CM

## 2022-01-24 DIAGNOSIS — Z3A25 25 weeks gestation of pregnancy: Secondary | ICD-10-CM

## 2022-01-24 LAB — POCT URINALYSIS DIPSTICK OB
Bilirubin, UA: NEGATIVE
Blood, UA: NEGATIVE
Glucose, UA: NEGATIVE
Leukocytes, UA: NEGATIVE
Nitrite, UA: NEGATIVE
POC,PROTEIN,UA: NEGATIVE
Spec Grav, UA: 1.01 (ref 1.010–1.025)
Urobilinogen, UA: 0.2 E.U./dL
pH, UA: 6.5 (ref 5.0–8.0)

## 2022-01-24 NOTE — Progress Notes (Signed)
ROB. Patient states fetal movement with back pain. Patient states occasional hand and feet swelling. Patient states after a walk last week she felt some vaginal sensitivity, like a bruise feeling, states feeling has resolved. Patient states no questions or concerns at this time.

## 2022-01-24 NOTE — Progress Notes (Signed)
ROB: Has occasional hand and feet swelling but this usually resolves.  Had a period of time last week where she had vulvar discomfort "like a bruise" she states that this has resolved as well.  We discussed both of these issues.  Decrease salt intake increase fluids elevate feet when possible possible use of support hose.  We have also discussed the possibility of varicose veins in the labia causing an aching bruise-like feeling after an increase in physical activity. 1 hour GCT next visit

## 2022-02-13 ENCOUNTER — Other Ambulatory Visit: Payer: Self-pay

## 2022-02-13 DIAGNOSIS — Z113 Encounter for screening for infections with a predominantly sexual mode of transmission: Secondary | ICD-10-CM

## 2022-02-13 DIAGNOSIS — Z131 Encounter for screening for diabetes mellitus: Secondary | ICD-10-CM

## 2022-02-13 DIAGNOSIS — Z3483 Encounter for supervision of other normal pregnancy, third trimester: Secondary | ICD-10-CM

## 2022-02-13 DIAGNOSIS — Z3A28 28 weeks gestation of pregnancy: Secondary | ICD-10-CM

## 2022-02-14 ENCOUNTER — Other Ambulatory Visit: Payer: Self-pay

## 2022-02-14 ENCOUNTER — Other Ambulatory Visit: Payer: 59

## 2022-02-14 ENCOUNTER — Encounter: Payer: Self-pay | Admitting: Obstetrics and Gynecology

## 2022-02-14 ENCOUNTER — Ambulatory Visit (INDEPENDENT_AMBULATORY_CARE_PROVIDER_SITE_OTHER): Payer: 59 | Admitting: Obstetrics and Gynecology

## 2022-02-14 VITALS — BP 114/66 | HR 81 | Wt 205.7 lb

## 2022-02-14 DIAGNOSIS — Z131 Encounter for screening for diabetes mellitus: Secondary | ICD-10-CM | POA: Diagnosis not present

## 2022-02-14 DIAGNOSIS — Z113 Encounter for screening for infections with a predominantly sexual mode of transmission: Secondary | ICD-10-CM | POA: Diagnosis not present

## 2022-02-14 DIAGNOSIS — Z3483 Encounter for supervision of other normal pregnancy, third trimester: Secondary | ICD-10-CM

## 2022-02-14 DIAGNOSIS — Z23 Encounter for immunization: Secondary | ICD-10-CM

## 2022-02-14 DIAGNOSIS — Z3A28 28 weeks gestation of pregnancy: Secondary | ICD-10-CM | POA: Diagnosis not present

## 2022-02-14 LAB — POCT URINALYSIS DIPSTICK OB
Bilirubin, UA: NEGATIVE
Blood, UA: NEGATIVE
Glucose, UA: NEGATIVE
Ketones, UA: NEGATIVE
Leukocytes, UA: NEGATIVE
Nitrite, UA: NEGATIVE
POC,PROTEIN,UA: NEGATIVE
Spec Grav, UA: 1.01 (ref 1.010–1.025)
Urobilinogen, UA: 0.2 E.U./dL
pH, UA: 6.5 (ref 5.0–8.0)

## 2022-02-14 NOTE — Patient Instructions (Signed)

## 2022-02-14 NOTE — Progress Notes (Signed)
ROB: She is doing well today, no new concerns. 

## 2022-02-14 NOTE — Progress Notes (Signed)
ROB: Patient complains of possible bruising in vaginal area. For 28 week labs today. Considering breastfeeding but unsure. Had issues with breastfeeding last pregnancy. Desires no method for contraception (has h/o PCOS and infertility, required Clomid for this pregnancy). For Tdap today, signed blood consent.  RTC in 2 weeks.  ? ? ?

## 2022-02-15 LAB — CBC WITH DIFFERENTIAL/PLATELET
Basophils Absolute: 0.1 10*3/uL (ref 0.0–0.2)
Basos: 1 %
EOS (ABSOLUTE): 0.2 10*3/uL (ref 0.0–0.4)
Eos: 3 %
Hematocrit: 34 % (ref 34.0–46.6)
Hemoglobin: 11.6 g/dL (ref 11.1–15.9)
Immature Grans (Abs): 0.1 10*3/uL (ref 0.0–0.1)
Immature Granulocytes: 1 %
Lymphocytes Absolute: 1.5 10*3/uL (ref 0.7–3.1)
Lymphs: 20 %
MCH: 30.5 pg (ref 26.6–33.0)
MCHC: 34.1 g/dL (ref 31.5–35.7)
MCV: 90 fL (ref 79–97)
Monocytes Absolute: 0.5 10*3/uL (ref 0.1–0.9)
Monocytes: 6 %
Neutrophils Absolute: 5.3 10*3/uL (ref 1.4–7.0)
Neutrophils: 69 %
Platelets: 283 10*3/uL (ref 150–450)
RBC: 3.8 x10E6/uL (ref 3.77–5.28)
RDW: 13.8 % (ref 11.7–15.4)
WBC: 7.6 10*3/uL (ref 3.4–10.8)

## 2022-02-15 LAB — RPR: RPR Ser Ql: NONREACTIVE

## 2022-02-15 LAB — GLUCOSE TOLERANCE, 1 HOUR: Glucose, 1Hr PP: 116 mg/dL (ref 70–199)

## 2022-02-24 ENCOUNTER — Encounter: Payer: Self-pay | Admitting: Obstetrics and Gynecology

## 2022-02-28 ENCOUNTER — Encounter: Payer: Self-pay | Admitting: Obstetrics and Gynecology

## 2022-02-28 ENCOUNTER — Ambulatory Visit (INDEPENDENT_AMBULATORY_CARE_PROVIDER_SITE_OTHER): Payer: 59 | Admitting: Obstetrics and Gynecology

## 2022-02-28 VITALS — BP 125/65 | HR 104 | Wt 203.3 lb

## 2022-02-28 DIAGNOSIS — Z3A30 30 weeks gestation of pregnancy: Secondary | ICD-10-CM

## 2022-02-28 DIAGNOSIS — Z3483 Encounter for supervision of other normal pregnancy, third trimester: Secondary | ICD-10-CM

## 2022-02-28 LAB — POCT URINALYSIS DIPSTICK OB
Bilirubin, UA: NEGATIVE
Blood, UA: NEGATIVE
Glucose, UA: NEGATIVE
Ketones, UA: NEGATIVE
Leukocytes, UA: NEGATIVE
Nitrite, UA: NEGATIVE
POC,PROTEIN,UA: NEGATIVE
Spec Grav, UA: 1.01 (ref 1.010–1.025)
Urobilinogen, UA: 0.2 E.U./dL
pH, UA: 6.5 (ref 5.0–8.0)
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
## 2022-03-14 ENCOUNTER — Encounter: Payer: Self-pay | Admitting: Obstetrics and Gynecology

## 2022-03-14 ENCOUNTER — Ambulatory Visit (INDEPENDENT_AMBULATORY_CARE_PROVIDER_SITE_OTHER): Payer: 59 | Admitting: Obstetrics and Gynecology

## 2022-03-14 VITALS — BP 107/67 | HR 93 | Wt 209.5 lb

## 2022-03-14 DIAGNOSIS — Z3483 Encounter for supervision of other normal pregnancy, third trimester: Secondary | ICD-10-CM

## 2022-03-14 DIAGNOSIS — Z3A32 32 weeks gestation of pregnancy: Secondary | ICD-10-CM

## 2022-03-14 DIAGNOSIS — E669 Obesity, unspecified: Secondary | ICD-10-CM

## 2022-03-14 LAB — POCT URINALYSIS DIPSTICK OB
Bilirubin, UA: NEGATIVE
Blood, UA: NEGATIVE
Glucose, UA: NEGATIVE
Ketones, UA: NEGATIVE
Leukocytes, UA: NEGATIVE
Nitrite, UA: NEGATIVE
Spec Grav, UA: 1.015 (ref 1.010–1.025)
Urobilinogen, UA: 0.2 E.U./dL
pH, UA: 7 (ref 5.0–8.0)

## 2022-03-14 NOTE — Progress Notes (Signed)
ROB: Notes no major issues. Is still having some mild issues with her gallbladder, trying dietary modifications. Discussed pain management in labor. RTC in 2 weeks. F ?

## 2022-03-14 NOTE — Progress Notes (Signed)
ROB: She is doing well, no new concerns today. ?

## 2022-03-28 ENCOUNTER — Encounter: Payer: Self-pay | Admitting: Obstetrics and Gynecology

## 2022-03-28 ENCOUNTER — Ambulatory Visit (INDEPENDENT_AMBULATORY_CARE_PROVIDER_SITE_OTHER): Payer: 59 | Admitting: Obstetrics and Gynecology

## 2022-03-28 VITALS — BP 107/67 | HR 103 | Wt 204.6 lb

## 2022-03-28 DIAGNOSIS — Z3483 Encounter for supervision of other normal pregnancy, third trimester: Secondary | ICD-10-CM

## 2022-03-28 DIAGNOSIS — Z3A34 34 weeks gestation of pregnancy: Secondary | ICD-10-CM

## 2022-03-28 LAB — POCT URINALYSIS DIPSTICK OB
Bilirubin, UA: NEGATIVE
Blood, UA: NEGATIVE
Glucose, UA: NEGATIVE
Ketones, UA: NEGATIVE
Leukocytes, UA: NEGATIVE
Nitrite, UA: NEGATIVE
POC,PROTEIN,UA: NEGATIVE
Spec Grav, UA: 1.015 (ref 1.010–1.025)
Urobilinogen, UA: 0.2 E.U./dL
pH, UA: 7 (ref 5.0–8.0)

## 2022-03-28 NOTE — Progress Notes (Signed)
ROB: Doing better with a low-fat diet.  Still has occasional burning sensation below her ribs.  Reports fetal movement but not as prominent when she has the above-mentioned pain.  Cultures next visit. ?

## 2022-03-28 NOTE — Progress Notes (Signed)
ROB. Patient states fetal movement with pelvic pressure. ?She states she is still having issues with her gallbladder, states she has been making dietary changes but still experiencing some burning. ? ?Patient states no questions or concerns at this time.   ?

## 2022-04-11 ENCOUNTER — Ambulatory Visit (INDEPENDENT_AMBULATORY_CARE_PROVIDER_SITE_OTHER): Payer: 59 | Admitting: Obstetrics and Gynecology

## 2022-04-11 VITALS — BP 110/76 | HR 98 | Wt 213.4 lb

## 2022-04-11 DIAGNOSIS — Z113 Encounter for screening for infections with a predominantly sexual mode of transmission: Secondary | ICD-10-CM

## 2022-04-11 DIAGNOSIS — Z3A36 36 weeks gestation of pregnancy: Secondary | ICD-10-CM

## 2022-04-11 DIAGNOSIS — Z3483 Encounter for supervision of other normal pregnancy, third trimester: Secondary | ICD-10-CM

## 2022-04-11 DIAGNOSIS — Z3685 Encounter for antenatal screening for Streptococcus B: Secondary | ICD-10-CM

## 2022-04-11 LAB — POCT URINALYSIS DIPSTICK OB
Bilirubin, UA: NEGATIVE
Blood, UA: NEGATIVE
Glucose, UA: NEGATIVE
Ketones, UA: NEGATIVE
Leukocytes, UA: NEGATIVE
Nitrite, UA: NEGATIVE
Spec Grav, UA: 1.02 (ref 1.010–1.025)
Urobilinogen, UA: 0.2 E.U./dL
pH, UA: 6.5 (ref 5.0–8.0)

## 2022-04-11 NOTE — Progress Notes (Signed)
ROB: She is doing well. No new concerns today. Cultures done today. °

## 2022-04-11 NOTE — Progress Notes (Signed)
ROB: Doing well, no issues. Notes some Braxton Hicks and pelvic pressure. 36 week cultures done today. Labor precautions given. RTC in 1 week.  ?

## 2022-04-13 LAB — STREP GP B NAA: Strep Gp B NAA: NEGATIVE

## 2022-04-14 LAB — GC/CHLAMYDIA PROBE AMP
Chlamydia trachomatis, NAA: NEGATIVE
Neisseria Gonorrhoeae by PCR: NEGATIVE

## 2022-04-19 ENCOUNTER — Ambulatory Visit: Payer: 59 | Admitting: Obstetrics and Gynecology

## 2022-04-19 ENCOUNTER — Encounter: Payer: Self-pay | Admitting: Obstetrics and Gynecology

## 2022-04-19 VITALS — BP 117/79 | HR 92 | Wt 213.2 lb

## 2022-04-19 DIAGNOSIS — Z3483 Encounter for supervision of other normal pregnancy, third trimester: Secondary | ICD-10-CM

## 2022-04-19 DIAGNOSIS — Z3A37 37 weeks gestation of pregnancy: Secondary | ICD-10-CM

## 2022-04-19 LAB — POCT URINALYSIS DIPSTICK OB
Bilirubin, UA: NEGATIVE
Blood, UA: NEGATIVE
Glucose, UA: NEGATIVE
Ketones, UA: NEGATIVE
Leukocytes, UA: NEGATIVE
Nitrite, UA: NEGATIVE
POC,PROTEIN,UA: NEGATIVE
Spec Grav, UA: 1.015 (ref 1.010–1.025)
Urobilinogen, UA: 0.2 E.U./dL
pH, UA: 6.5 (ref 5.0–8.0)

## 2022-04-19 NOTE — Progress Notes (Unsigned)
ROB. Patient states fetal movement with increased pressure. She states having braxton hicks contractions but nothing consistent. Patient states no questions or concerns at this time. Patient left before seeing provider due to emergent surgery.

## 2022-04-25 ENCOUNTER — Encounter: Payer: Self-pay | Admitting: Obstetrics and Gynecology

## 2022-04-25 ENCOUNTER — Ambulatory Visit (INDEPENDENT_AMBULATORY_CARE_PROVIDER_SITE_OTHER): Payer: 59 | Admitting: Obstetrics and Gynecology

## 2022-04-25 VITALS — Wt 215.0 lb

## 2022-04-25 DIAGNOSIS — Z3A38 38 weeks gestation of pregnancy: Secondary | ICD-10-CM

## 2022-04-25 DIAGNOSIS — O99211 Obesity complicating pregnancy, first trimester: Secondary | ICD-10-CM

## 2022-04-25 DIAGNOSIS — Z3483 Encounter for supervision of other normal pregnancy, third trimester: Secondary | ICD-10-CM

## 2022-04-25 LAB — POCT URINALYSIS DIPSTICK OB
Bilirubin, UA: NEGATIVE
Blood, UA: NEGATIVE
Glucose, UA: NEGATIVE
Ketones, UA: NEGATIVE
Nitrite, UA: NEGATIVE
POC,PROTEIN,UA: NEGATIVE
Spec Grav, UA: 1.01 (ref 1.010–1.025)
Urobilinogen, UA: 0.2 E.U./dL
pH, UA: 7.5 (ref 5.0–8.0)

## 2022-04-25 NOTE — Progress Notes (Signed)
ROB: Notes she is feeling more pressure. Patient inquires about membrane sweeping. Advised that this could be performed after 39 weeks if cervix is favorable.  Discussed natural cervical ripening supplements, encouraged walking. RTC in 1 week.

## 2022-04-25 NOTE — Progress Notes (Signed)
ROB: She is doing well, no new concerns. 

## 2022-05-01 ENCOUNTER — Ambulatory Visit (INDEPENDENT_AMBULATORY_CARE_PROVIDER_SITE_OTHER): Payer: 59 | Admitting: Obstetrics and Gynecology

## 2022-05-01 ENCOUNTER — Encounter: Payer: Self-pay | Admitting: Obstetrics and Gynecology

## 2022-05-01 VITALS — BP 107/76 | HR 105 | Wt 216.4 lb

## 2022-05-01 DIAGNOSIS — Z3483 Encounter for supervision of other normal pregnancy, third trimester: Secondary | ICD-10-CM

## 2022-05-01 DIAGNOSIS — Z3A39 39 weeks gestation of pregnancy: Secondary | ICD-10-CM

## 2022-05-01 LAB — POCT URINALYSIS DIPSTICK OB
Bilirubin, UA: NEGATIVE
Blood, UA: NEGATIVE
Glucose, UA: NEGATIVE
Ketones, UA: NEGATIVE
Leukocytes, UA: NEGATIVE
Nitrite, UA: NEGATIVE
POC,PROTEIN,UA: NEGATIVE
Spec Grav, UA: 1.01 (ref 1.010–1.025)
Urobilinogen, UA: 0.2 E.U./dL
pH, UA: 6.5 (ref 5.0–8.0)

## 2022-05-01 NOTE — Progress Notes (Signed)
ROB. Patient states fetal movement with increased pressure. She states cramping over the past week but no braxton hicks or contractions to her knowledge. Patient states rib burning pain is still present. Patient states no questions or concerns at this time.

## 2022-05-01 NOTE — Progress Notes (Signed)
ROB: Patient having menstrual type cramps but no contractions.  Feels daily movement.  Discussed possibility of induction if patient goes 1 week postdates.  BPP scheduled for next visit.  Signs and symptoms of labor discussed.

## 2022-05-08 ENCOUNTER — Ambulatory Visit (INDEPENDENT_AMBULATORY_CARE_PROVIDER_SITE_OTHER): Payer: 59 | Admitting: Obstetrics and Gynecology

## 2022-05-08 ENCOUNTER — Ambulatory Visit (INDEPENDENT_AMBULATORY_CARE_PROVIDER_SITE_OTHER): Payer: 59

## 2022-05-08 VITALS — BP 130/95 | HR 100 | Wt 218.0 lb

## 2022-05-08 DIAGNOSIS — R030 Elevated blood-pressure reading, without diagnosis of hypertension: Secondary | ICD-10-CM

## 2022-05-08 DIAGNOSIS — Z3A40 40 weeks gestation of pregnancy: Secondary | ICD-10-CM

## 2022-05-08 DIAGNOSIS — O480 Post-term pregnancy: Secondary | ICD-10-CM

## 2022-05-08 DIAGNOSIS — Z3A39 39 weeks gestation of pregnancy: Secondary | ICD-10-CM

## 2022-05-08 DIAGNOSIS — Z3483 Encounter for supervision of other normal pregnancy, third trimester: Secondary | ICD-10-CM

## 2022-05-08 NOTE — Progress Notes (Signed)
No vb. No lof. Cervical check today. Had contractions every 3-5 minutes yesterday "all day"

## 2022-05-08 NOTE — Patient Instructions (Addendum)
     COVID 19 Instructions for Scheduled Procedure (Inductions/C-sections and GYN surgeries)   Thank you for choosing [facility] for your services.  You have been scheduled for a procedure called ______Induction of Labor________________.    Your procedure is scheduled on ______Sunday May 13, 2022 at midnight________.   Upon your scheduled procedure date, you will need to arrive at the Emergency Room entrance.   Please arrive on time if you are scheduled for an induction of labor.   If you are scheduled for a Cesarean delivery or for Gyn Surgery, arrive 2 hours prior to your procedure time.  If you are an Obstetric patient and your arrival time falls between 11 PM and 6 AM call L&D [phone]) when you arrive.  A staff member will meet you at the [facility] entrance.  At this time, patients are allowed 2 support persons to accompany them.   Please contact the office if you have any questions regarding this information.  The [facility] office number is [phone].     Thank you,    Your [facility]            OTHER MEDICATIONS FOR CERVICAL RIPENING

## 2022-05-08 NOTE — Progress Notes (Signed)
ROB: Patient notes cramping yesterday all day, possibly even contractions but were not painful. Desires membrane sweeping, attempted to perform however cervix high in pelvis, difficult to sweep. Discussed IOL if no delivery by 41 weeks, scheduled 05/13/2022 at midnight. Reviewed labor precautions. BP borderline elevated today, has h/o gHTN in prior pregnancy. Normal BPP today.

## 2022-05-09 ENCOUNTER — Other Ambulatory Visit: Payer: 59

## 2022-05-09 ENCOUNTER — Encounter: Payer: 59 | Admitting: Obstetrics and Gynecology

## 2022-05-11 ENCOUNTER — Other Ambulatory Visit: Payer: Self-pay

## 2022-05-11 ENCOUNTER — Encounter: Payer: Self-pay | Admitting: Obstetrics and Gynecology

## 2022-05-11 ENCOUNTER — Inpatient Hospital Stay: Payer: 59 | Admitting: Registered Nurse

## 2022-05-11 ENCOUNTER — Inpatient Hospital Stay
Admission: EM | Admit: 2022-05-11 | Discharge: 2022-05-13 | DRG: 768 | Disposition: A | Discharge status: Home / Self Care | Payer: 59 | Attending: Obstetrics | Admitting: Obstetrics

## 2022-05-11 DIAGNOSIS — Z3A40 40 weeks gestation of pregnancy: Secondary | ICD-10-CM

## 2022-05-11 DIAGNOSIS — O480 Post-term pregnancy: Secondary | ICD-10-CM | POA: Diagnosis not present

## 2022-05-11 DIAGNOSIS — Z8759 Personal history of other complications of pregnancy, childbirth and the puerperium: Secondary | ICD-10-CM

## 2022-05-11 DIAGNOSIS — F411 Generalized anxiety disorder: Secondary | ICD-10-CM

## 2022-05-11 DIAGNOSIS — O09293 Supervision of pregnancy with other poor reproductive or obstetric history, third trimester: Secondary | ICD-10-CM | POA: Diagnosis not present

## 2022-05-11 DIAGNOSIS — Z23 Encounter for immunization: Secondary | ICD-10-CM | POA: Diagnosis not present

## 2022-05-11 DIAGNOSIS — N970 Female infertility associated with anovulation: Secondary | ICD-10-CM

## 2022-05-11 DIAGNOSIS — J101 Influenza due to other identified influenza virus with other respiratory manifestations: Secondary | ICD-10-CM

## 2022-05-11 DIAGNOSIS — E669 Obesity, unspecified: Secondary | ICD-10-CM

## 2022-05-11 DIAGNOSIS — O99214 Obesity complicating childbirth: Secondary | ICD-10-CM | POA: Diagnosis present

## 2022-05-11 DIAGNOSIS — E282 Polycystic ovarian syndrome: Secondary | ICD-10-CM

## 2022-05-11 LAB — CBC
HCT: 37.1 % (ref 36.0–46.0)
HCT: 29.8 % — ABNORMAL LOW (ref 36.0–46.0)
Hemoglobin: 12.3 g/dL (ref 12.0–15.0)
Hemoglobin: 9.9 g/dL — ABNORMAL LOW (ref 12.0–15.0)
MCH: 29.5 pg (ref 26.0–34.0)
MCH: 30 pg (ref 26.0–34.0)
MCHC: 33.2 g/dL (ref 30.0–36.0)
MCHC: 33.2 g/dL (ref 30.0–36.0)
MCV: 89 fL (ref 80.0–100.0)
MCV: 90.3 fL (ref 80.0–100.0)
Platelets: 254 10*3/uL (ref 150–400)
Platelets: 243 10*3/uL (ref 150–400)
RBC: 4.17 MIL/uL (ref 3.87–5.11)
RBC: 3.3 MIL/uL — ABNORMAL LOW (ref 3.87–5.11)
RDW: 14.5 % (ref 11.5–15.5)
RDW: 14.6 % (ref 11.5–15.5)
WBC: 9.3 10*3/uL (ref 4.0–10.5)
WBC: 11.2 10*3/uL — ABNORMAL HIGH (ref 4.0–10.5)
nRBC: 0 % (ref 0.0–0.2)
nRBC: 0 % (ref 0.0–0.2)

## 2022-05-11 LAB — FIBRINOGEN: Fibrinogen: 383 mg/dL (ref 210–475)

## 2022-05-11 LAB — PROTIME-INR
INR: 1.1 (ref 0.8–1.2)
Prothrombin Time: 14 seconds (ref 11.4–15.2)

## 2022-05-11 LAB — APTT: aPTT: 26 seconds (ref 24–36)

## 2022-05-11 MED ORDER — LIDOCAINE HCL (PF) 1 % IJ SOLN: 30.0000 mL | INTRAMUSCULAR | Status: DC | PRN

## 2022-05-11 MED ORDER — MISOPROSTOL 200 MCG PO TABS
ORAL_TABLET | ORAL | Status: AC
  Filled 2022-05-11: qty 4

## 2022-05-11 MED ORDER — OXYTOCIN 10 UNIT/ML IJ SOLN
INTRAMUSCULAR | Status: AC
  Filled 2022-05-11: qty 2

## 2022-05-11 MED ORDER — FENTANYL-BUPIVACAINE-NACL 0.5-0.125-0.9 MG/250ML-% EP SOLN
EPIDURAL | Status: AC
  Filled 2022-05-11: qty 250

## 2022-05-11 MED ORDER — BUPIVACAINE HCL (PF) 0.25 % IJ SOLN
INTRAMUSCULAR | Status: DC | PRN
  Administered 2022-05-11 (×2): 4 mL via EPIDURAL

## 2022-05-11 MED ORDER — LACTATED RINGERS IV SOLN
500.0000 mL | INTRAVENOUS | Status: DC | PRN
  Administered 2022-05-11: 1000 mL via INTRAVENOUS

## 2022-05-11 MED ORDER — LIDOCAINE-EPINEPHRINE (PF) 1.5 %-1:200000 IJ SOLN
INTRAMUSCULAR | Status: DC | PRN
  Administered 2022-05-11: 3 mL via EPIDURAL

## 2022-05-11 MED ORDER — OXYCODONE-ACETAMINOPHEN 5-325 MG PO TABS: 2.0000 | ORAL_TABLET | ORAL | Status: DC | PRN

## 2022-05-11 MED ORDER — PRENATAL MULTIVITAMIN CH
1.0000 | ORAL_TABLET | Freq: Every day | ORAL | Status: DC
  Administered 2022-05-11 – 2022-05-12 (×2): 1 via ORAL
  Filled 2022-05-11 (×2): qty 1

## 2022-05-11 MED ORDER — WITCH HAZEL-GLYCERIN EX PADS
1.0000 | MEDICATED_PAD | CUTANEOUS | Status: DC | PRN
Start: 2022-05-11 — End: 2022-05-13
  Administered 2022-05-11: 1 via TOPICAL
  Filled 2022-05-11: qty 100

## 2022-05-11 MED ORDER — OXYTOCIN BOLUS FROM INFUSION
333.0000 mL | Freq: Once | INTRAVENOUS | Status: AC
  Administered 2022-05-11: 333 mL via INTRAVENOUS

## 2022-05-11 MED ORDER — BENZOCAINE-MENTHOL 20-0.5 % EX AERO
1.0000 | INHALATION_SPRAY | CUTANEOUS | Status: DC | PRN
Start: 2022-05-11 — End: 2022-05-13
  Administered 2022-05-11: 1 via TOPICAL
  Filled 2022-05-11: qty 56

## 2022-05-11 MED ORDER — SOD CITRATE-CITRIC ACID 500-334 MG/5ML PO SOLN: 30.0000 mL | ORAL | Status: DC | PRN

## 2022-05-11 MED ORDER — ACETAMINOPHEN 325 MG PO TABS
650.0000 mg | ORAL_TABLET | ORAL | Status: DC | PRN
Start: 2022-05-11 — End: 2022-05-11

## 2022-05-11 MED ORDER — PROMETHAZINE HCL 25 MG/ML IJ SOLN
INTRAMUSCULAR | Status: AC
  Filled 2022-05-11: qty 1

## 2022-05-11 MED ORDER — VARICELLA VIRUS VACCINE LIVE 1350 PFU/0.5ML IJ SUSR
0.5000 mL | INTRAMUSCULAR | Status: AC | PRN
  Administered 2022-05-13: 0.5 mL via SUBCUTANEOUS
  Filled 2022-05-11 (×2): qty 0.5

## 2022-05-11 MED ORDER — METHYLERGONOVINE MALEATE 0.2 MG/ML IJ SOLN: 0.2000 mg | INTRAMUSCULAR | Status: DC | PRN

## 2022-05-11 MED ORDER — OXYTOCIN-SODIUM CHLORIDE 30-0.9 UT/500ML-% IV SOLN: 2.5000 [IU]/h | INTRAVENOUS | Status: DC

## 2022-05-11 MED ORDER — ONDANSETRON HCL 4 MG PO TABS: 4.0000 mg | ORAL_TABLET | ORAL | Status: DC | PRN

## 2022-05-11 MED ORDER — LACTATED RINGERS IV SOLN: INTRAVENOUS | Status: DC

## 2022-05-11 MED ORDER — SODIUM CHLORIDE 0.9% IV SOLUTION: Freq: Once | INTRAVENOUS | Status: DC

## 2022-05-11 MED ORDER — COCONUT OIL OIL
1.0000 | TOPICAL_OIL | Status: DC | PRN
Start: 2022-05-11 — End: 2022-05-13

## 2022-05-11 MED ORDER — SODIUM CHLORIDE 0.9 % IV SOLN
25.0000 mg | Freq: Once | INTRAVENOUS | Status: DC
  Filled 2022-05-11: qty 1

## 2022-05-11 MED ORDER — DIPHENHYDRAMINE HCL 25 MG PO CAPS: 25.0000 mg | ORAL_CAPSULE | Freq: Four times a day (QID) | ORAL | Status: DC | PRN

## 2022-05-11 MED ORDER — OXYTOCIN-SODIUM CHLORIDE 30-0.9 UT/500ML-% IV SOLN
INTRAVENOUS | Status: AC
  Filled 2022-05-11: qty 500

## 2022-05-11 MED ORDER — MORPHINE SULFATE (PF) 4 MG/ML IV SOLN
INTRAVENOUS | Status: AC
  Filled 2022-05-11: qty 1

## 2022-05-11 MED ORDER — TERBUTALINE SULFATE 1 MG/ML IJ SOLN: 0.2500 mg | Freq: Once | INTRAMUSCULAR | Status: DC | PRN

## 2022-05-11 MED ORDER — ONDANSETRON HCL 4 MG/2ML IJ SOLN: 4.0000 mg | INTRAMUSCULAR | Status: DC | PRN

## 2022-05-11 MED ORDER — METHYLERGONOVINE MALEATE 0.2 MG/ML IJ SOLN
INTRAMUSCULAR | Status: AC
  Administered 2022-05-11: 0.2 mg
  Filled 2022-05-11: qty 1

## 2022-05-11 MED ORDER — SENNOSIDES-DOCUSATE SODIUM 8.6-50 MG PO TABS
2.0000 | ORAL_TABLET | Freq: Every day | ORAL | Status: DC
  Administered 2022-05-12: 2 via ORAL
  Filled 2022-05-11: qty 2

## 2022-05-11 MED ORDER — ONDANSETRON HCL 4 MG/2ML IJ SOLN: 4.0000 mg | Freq: Four times a day (QID) | INTRAMUSCULAR | Status: DC | PRN

## 2022-05-11 MED ORDER — ACETAMINOPHEN 325 MG PO TABS
650.0000 mg | ORAL_TABLET | ORAL | Status: DC | PRN
  Administered 2022-05-11: 650 mg via ORAL
  Filled 2022-05-11: qty 2

## 2022-05-11 MED ORDER — OXYTOCIN-SODIUM CHLORIDE 30-0.9 UT/500ML-% IV SOLN: 1.0000 m[IU]/min | INTRAVENOUS | Status: DC

## 2022-05-11 MED ORDER — MORPHINE SULFATE (PF) 4 MG/ML IV SOLN
4.0000 mg | Freq: Once | INTRAVENOUS | Status: AC
  Administered 2022-05-11: 4 mg via INTRAVENOUS

## 2022-05-11 MED ORDER — OXYCODONE-ACETAMINOPHEN 5-325 MG PO TABS: 1.0000 | ORAL_TABLET | ORAL | Status: DC | PRN

## 2022-05-11 MED ORDER — AMMONIA AROMATIC IN INHA
RESPIRATORY_TRACT | Status: AC
  Filled 2022-05-11: qty 10

## 2022-05-11 MED ORDER — CEFAZOLIN SODIUM-DEXTROSE 2-4 GM/100ML-% IV SOLN
2.0000 g | Freq: Once | INTRAVENOUS | Status: AC
  Administered 2022-05-11: 2 g via INTRAVENOUS
  Filled 2022-05-11: qty 100

## 2022-05-11 MED ORDER — LIDOCAINE HCL (PF) 1 % IJ SOLN
INTRAMUSCULAR | Status: DC | PRN
  Administered 2022-05-11: 3 mL via SUBCUTANEOUS

## 2022-05-11 MED ORDER — OXYTOCIN-SODIUM CHLORIDE 30-0.9 UT/500ML-% IV SOLN
INTRAVENOUS | Status: AC
  Administered 2022-05-11: 4 m[IU]/min via INTRAVENOUS
  Filled 2022-05-11: qty 500

## 2022-05-11 MED ORDER — SIMETHICONE 80 MG PO CHEW
80.0000 mg | CHEWABLE_TABLET | ORAL | Status: DC | PRN
  Administered 2022-05-11: 80 mg via ORAL
  Filled 2022-05-11: qty 1

## 2022-05-11 MED ORDER — ZOLPIDEM TARTRATE 5 MG PO TABS: 5.0000 mg | ORAL_TABLET | Freq: Every evening | ORAL | Status: DC | PRN

## 2022-05-11 MED ORDER — CARBOPROST TROMETHAMINE 250 MCG/ML IM SOLN
INTRAMUSCULAR | Status: AC
  Filled 2022-05-11: qty 1

## 2022-05-11 MED ORDER — LIDOCAINE HCL (PF) 1 % IJ SOLN
INTRAMUSCULAR | Status: AC
  Filled 2022-05-11: qty 30

## 2022-05-11 MED ORDER — FENTANYL-BUPIVACAINE-NACL 0.5-0.125-0.9 MG/250ML-% EP SOLN
EPIDURAL | Status: DC | PRN
  Administered 2022-05-11: 12 mL/h via EPIDURAL

## 2022-05-11 MED ORDER — DIBUCAINE (PERIANAL) 1 % EX OINT
1.0000 "application " | TOPICAL_OINTMENT | CUTANEOUS | Status: DC | PRN
  Administered 2022-05-11: 1 via RECTAL
  Filled 2022-05-11: qty 28

## 2022-05-11 MED ORDER — IBUPROFEN 600 MG PO TABS
600.0000 mg | ORAL_TABLET | Freq: Four times a day (QID) | ORAL | Status: DC
  Administered 2022-05-11 – 2022-05-13 (×7): 600 mg via ORAL
  Filled 2022-05-11 (×7): qty 1

## 2022-05-11 MED ORDER — TRANEXAMIC ACID-NACL 1000-0.7 MG/100ML-% IV SOLN
INTRAVENOUS | Status: AC
  Filled 2022-05-11: qty 100

## 2022-05-11 NOTE — Anesthesia Preprocedure Evaluation (Signed)
Anesthesia Evaluation  Patient identified by MRN, date of birth, ID band Patient awake    Reviewed: Allergy & Precautions, H&P , NPO status , Patient's Chart, lab work & pertinent test results  Airway Mallampati: III  TM Distance: >3 FB Neck ROM: full    Dental   Pulmonary    Pulmonary exam normal        Cardiovascular Normal cardiovascular exam     Neuro/Psych PSYCHIATRIC DISORDERS Anxiety negative neurological ROS     GI/Hepatic negative GI ROS, Neg liver ROS,   Endo/Other  negative endocrine ROS  Renal/GU negative Renal ROS  negative genitourinary   Musculoskeletal   Abdominal   Peds  Hematology negative hematology ROS (+)   Anesthesia Other Findings   Reproductive/Obstetrics (+) Pregnancy                             Anesthesia Physical Anesthesia Plan  ASA: 2  Anesthesia Plan: Epidural   Post-op Pain Management:    Induction:   PONV Risk Score and Plan:   Airway Management Planned:   Additional Equipment:   Intra-op Plan:   Post-operative Plan:   Informed Consent: I have reviewed the patients History and Physical, chart, labs and discussed the procedure including the risks, benefits and alternatives for the proposed anesthesia with the patient or authorized representative who has indicated his/her understanding and acceptance.     Dental Advisory Given  Plan Discussed with: Anesthesiologist and CRNA  Anesthesia Plan Comments:         Anesthesia Quick Evaluation

## 2022-05-11 NOTE — H&P (Signed)
Obstetric History and Physical  Forrestine Lecrone is a 31 y.o. G2P1001 with IUP at [redacted]w[redacted]d presenting for contractions every 3-5 minutes. Contractions have been ongoing x 2 days have have become progressively stronger. Patient denies vaginal bleeding, reports intact membranes, with active fetal movement.    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   Prenatal labs and studies: ABO, Rh: O/Positive/-- (10/20 1525) Antibody: Negative (10/20 1525) Rubella: 1.17 (10/20 1525) RPR: Non Reactive (03/15 1438)  HBsAg: Negative (10/20 1525)  HIV: Non Reactive (10/20 1525)  UUV:OZDGUYQI/-- (05/10 1348) 1 hr Glucola  normal Genetic screening normal Anatomy US normal   Past Medical History:  Diagnosis Date   PCOS (polycystic ovarian syndrome)     Past Surgical History:  Procedure Laterality Date   NO PAST SURGERIES      OB History  Gravida Para Term Preterm AB Living  2 1 1  0 0 1  SAB IAB Ectopic Multiple Live Births  0 0 0 0 1    # Outcome Date GA Lbr Len/2nd Weight Sex Delivery Anes PTL Lv  2 Current           1 Term 10/24/18 [redacted]w[redacted]d / 01:53 2790 g M Vag-Spont EPI  LIV    Social History   Socioeconomic History   Marital status: Married    Spouse name: Ramon   Number of children: 1   Years of education: Not on file   Highest education level: Not on file  Occupational History   Not on file  Tobacco Use   Smoking status: Never   Smokeless tobacco: Never  Vaping  Use   Vaping Use: Never used  Substance and Sexual Activity   Alcohol use: Not Currently    Alcohol/week: 1.0 standard drink of alcohol    Types: 1 Standard drinks or equivalent per week    Comment: occassional   Drug use: No   Sexual activity: Yes    Comment: undecided  Other Topics Concern   Not on file  Social History Narrative   Not on file   Social Determinants of Health   Financial Resource Strain: Not on file  Food Insecurity: Not on file  Transportation Needs: Not on file  Physical Activity: Not on file  Stress: Not on file  Social Connections: Not on file    Family History  Problem Relation Age of Onset   Anemia Mother    Healthy Father    Colon cancer Maternal Grandmother    Breast cancer Neg Hx     Medications Prior to Admission  Medication Sig Dispense Refill Last Dose   Prenatal Vit-Fe Fumarate-FA (MULTIVITAMIN-PRENATAL) 27-0.8 MG TABS tablet Take 1 tablet by mouth daily at 12 noon.   05/10/2022    No Known Allergies  Review of Systems: Negative except for what is mentioned in HPI.  Physical Exam: BP 128/84 (BP Location: Right Arm)   Pulse 79   Temp 98 F (36.7 C) (Oral)   Resp 18   Ht 5\' 3"  (1.6 m)   Wt  98.4 kg   LMP 08/01/2021 (Exact Date)   BMI 38.44 kg/m  CONSTITUTIONAL: Well-developed, well-nourished female in no acute distress.  HENT:  Normocephalic, atraumatic, External right and left ear normal. Oropharynx is clear and moist EYES: Conjunctivae and EOM are normal. Pupils are equal, round, and reactive to light. No scleral icterus.  NECK: Normal range of motion, supple, no masses SKIN: Skin is warm and dry. No rash noted. Not diaphoretic. No erythema. No pallor. NEUROLOGIC: Alert and oriented to person, place, and time. Normal reflexes, muscle tone coordination. No cranial nerve deficit noted. PSYCHIATRIC: Normal mood and affect. Normal behavior. Normal judgment and thought content. CARDIOVASCULAR: Normal heart rate noted, regular  rhythm RESPIRATORY: Effort and breath sounds normal, no problems with respiration noted ABDOMEN: Soft, nontender, nondistended, gravid. MUSCULOSKELETAL: Normal range of motion. No edema and no tenderness. 2+ distal pulses.  Cervical Exam: Dilatation 5 cm   Effacement 80%   Station -2   Presentation: cephalic FHT:  Baseline rate 140 bpm   Variability moderate  Accelerations present   Decelerations none Contractions: Every 3-5 mins   Pertinent Labs/Studies:   No results found for this or any previous visit (from the past 24 hour(s)).  Assessment : Deltha Bernales is a 31 y.o. G2P1001 at [redacted]w[redacted]d being admitted for labor. History of secondary infertility and PCOS conceived on Clomid. H/o anxiety (no meds). H/o GHTN.  Plan: Labor: Admission labs ordered. Expectant management. Augmentation as ordered as per protocol. Analgesia as needed. FWB: Reassuring fetal heart tracing.  GBS negative Delivery plan: Hopeful for vaginal delivery   Hildred Laser, MD [facility]

## 2022-05-11 NOTE — Progress Notes (Signed)
Jada deflated at 2043

## 2022-05-11 NOTE — Progress Notes (Signed)
Jada removed without difficulty. Kristina Rubio is feeling much better. Blood transfusion is complete. She was able to ambulate to the bathroom and voided a large amount. Fundus is firm and bleeding is minimal. Stable to return to Mother/Baby unit.  Guadlupe Spanish, CNM

## 2022-05-11 NOTE — Progress Notes (Signed)
Suction to Kristina Rubio turned off at 1917 per provider verbal order.

## 2022-05-11 NOTE — Progress Notes (Signed)
Called to bedside for patient who had lost 500 cc blood with uterine atony. By the time I arrived she had lost about 800 cc. During this time she had BP to 60/50. Her bladder was evacuated with 700 cc urine. Pitocin was restarted. Labs were ordered and she was given an LR bolus ordered by Dr. Valentino Saxon. She was also given Methergine 0.2 mg IM. I examined patient and she continued to pass large clots as I evacuated the uterus. There remained some clots due to pain with procedure. Decision was made to place [place]. She was moved back to L&D for procedure. Patient was given Morphine 4 mg for pain control. She had bilateral labial tears which were hemostatic and not requring suture. Visualization of the cervix was difficult. Ring forcep was placed on cervix and no tears were observed but it was difficult to see. The Jada device was placed in the usual fashion and suction to 80 mmHG. Since placement there has been minimal flow. Hgb was 9.9 drawn around 1740. Calculated EBL to this point is 1675 ml including her PP loss of 550.  She has been typed and crossed for 2 units. We will give one unit now. Current vitals are stable with BP of  110/67. Missy Chryl Heck will remain on the unit for removal of the [place]. Partner at bedside with infant.    Lenah Messenger 05/11/2022 6:32 PM

## 2022-05-11 NOTE — Anesthesia Procedure Notes (Signed)
Epidural Patient location during procedure: OB Start time: 05/11/2022 10:38 AM End time: 05/11/2022 10:45 AM  Staffing Anesthesiologist: Yevette Edwards, MD Resident/CRNA: Karoline Caldwell, CRNA Performed: resident/CRNA   Preanesthetic Checklist Completed: patient identified, IV checked, site marked, risks and benefits discussed, surgical consent, monitors and equipment checked, pre-op evaluation and timeout performed  Epidural Patient position: sitting Prep: ChloraPrep Patient monitoring: heart rate, continuous pulse ox and blood pressure Approach: midline Location: L3-L4 Injection technique: LOR air  Needle:  Needle type: Tuohy  Needle gauge: 17 G Needle length: 9 cm and 9 Needle insertion depth: 5 cm Catheter type: closed end flexible Catheter size: 19 Gauge Catheter at skin depth: 10 cm Test dose: negative and 1.5% lidocaine with Epi 1:200 K  Assessment Events: blood not aspirated, injection not painful, no injection resistance, no paresthesia and negative IV test  Additional Notes 1 attempt Pt. Evaluated and documentation done after procedure finished. Patient identified. Risks/Benefits/Options discussed with patient including but not limited to bleeding, infection, nerve damage, paralysis, failed block, incomplete pain control, headache, blood pressure changes, nausea, vomiting, reactions to medication both or allergic, itching and postpartum back pain. Confirmed with bedside nurse the patient's most recent platelet count. Confirmed with patient that they are not currently taking any anticoagulation, have any bleeding history or any family history of bleeding disorders. Patient expressed understanding and wished to proceed. All questions were answered. Sterile technique was used throughout the entire procedure. Please see nursing notes for vital signs. Test dose was given through epidural catheter and negative prior to continuing to dose epidural or start infusion. Warning signs of  high block given to the patient including shortness of breath, tingling/numbness in hands, complete motor block, or any concerning symptoms with instructions to call for help. Patient was given instructions on fall risk and not to get out of bed. All questions and concerns addressed with instructions to call with any issues or inadequate analgesia.    Patient tolerated the insertion well without immediate complications.Reason for block:procedure for pain

## 2022-05-11 NOTE — OB Triage Note (Signed)
Pt is aG2P1001 at [redacted]w[redacted]d that presents from ED with c/o ctx that started two days ago but have become stronger and coming every 3-5 minutes. Pt denies VB, LOF and states positive FM. EFM applied.

## 2022-05-11 NOTE — Progress Notes (Signed)
1700 entered patients room and she state that she was having a lot of bleeding Bleeding was moderate began fundal massage at that time bleeding increased and a large clot was expelled, initiated staff assist alarm. Several nurses responded. 1715 BP 70/40 blood loss was 360 1730 Pitocin bolus started and report given to MD who stated she was on her way.  17 42 Methergine given 1748 I/O cath done 600 out. MD at bedside evacuated blood clots  1755 Patient brought to [facility]

## 2022-05-12 LAB — CBC
HCT: 26.5 % — ABNORMAL LOW (ref 36.0–46.0)
Hemoglobin: 8.9 g/dL — ABNORMAL LOW (ref 12.0–15.0)
MCH: 30 pg (ref 26.0–34.0)
MCHC: 33.6 g/dL (ref 30.0–36.0)
MCV: 89.2 fL (ref 80.0–100.0)
Platelets: 205 10*3/uL (ref 150–400)
RBC: 2.97 MIL/uL — ABNORMAL LOW (ref 3.87–5.11)
RDW: 15.1 % (ref 11.5–15.5)
WBC: 9.6 10*3/uL (ref 4.0–10.5)
nRBC: 0 % (ref 0.0–0.2)

## 2022-05-12 LAB — RPR: RPR Ser Ql: NONREACTIVE

## 2022-05-12 NOTE — Lactation Note (Signed)
This note was copied from a baby's chart. Lactation Consultation Note  Patient Name: Kristina Rubio FVCBS'W Date: 05/12/2022 Reason for consult: Initial assessment Age:30 hours  Maternal Data  Lactation rounds, mother of baby states that she is no longer interested in breastfeeding baby , she will formula feed baby  Feeding Mother's Current Feeding Choice: Formula  LATCH Score Latch:  (mom has decided not to breast feed)                  Lactation Tools Discussed/Used    Interventions  LC name and no written on white board for mom to call for any questions or concerns or if she decides to breast feed.  Discharge    Consult Status Consult Status: PRN    Dyann Kief 05/12/2022, 11:19 AM

## 2022-05-12 NOTE — Anesthesia Postprocedure Evaluation (Signed)
Anesthesia Post Note  Patient: Kristina Rubio  Procedure(s) Performed: AN AD HOC LABOR EPIDURAL  Patient location during evaluation: Mother Baby Anesthesia Type: Epidural Level of consciousness: awake and alert Pain management: pain level controlled Vital Signs Assessment: post-procedure vital signs reviewed and stable Respiratory status: spontaneous breathing, nonlabored ventilation and respiratory function stable Cardiovascular status: blood pressure returned to baseline and stable Postop Assessment: no apparent nausea or vomiting, no backache, no headache and able to ambulate Anesthetic complications: no   No notable events documented.   Last Vitals:  Vitals:   05/12/22 0820 05/12/22 1154  BP: 114/70 116/65  Pulse: 78 93  Resp: (!) 22 20  Temp: 36.7 C 37.1 C  SpO2: 99% 99%    Last Pain:  Vitals:   05/12/22 1154  TempSrc: Oral  PainSc:                  Foye Deer

## 2022-05-12 NOTE — Progress Notes (Signed)
Progress Note - Vaginal Delivery  Kristina Rubio is a 31 y.o. G2P2002 now PP day 1 s/p Vaginal, Spontaneous  and postpartum hemorrhage.   Subjective:  The patient reports no complaints, up ad lib, voiding, and tolerating PO   Objective:  Vital signs in last 24 hours: Temp:  [97.8 F (36.6 C)-98.6 F (37 C)] 98 F (36.7 C) (06/10 0820) Pulse Rate:  [62-90] 78 (06/10 0820) Resp:  [17-22] 22 (06/10 0820) BP: (107-141)/(63-104) 114/70 (06/10 0820) SpO2:  [98 %-100 %] 99 % (06/10 0820)  Physical Exam:  General: alert, cooperative, and appears stated age Lochia: appropriate Uterine Fundus: firm    Data Review Recent Labs    05/11/22 1751 05/12/22 0547  HGB 9.9* 8.9*  HCT 29.8* 26.5*    Assessment/Plan: Principal Problem:   Labor and delivery, indication for care   Plan for discharge tomorrow   -- Continue routine PP care.     Doreene Burke, CNM  05/12/2022 10:17 AM

## 2022-05-13 MED ORDER — FERROUS SULFATE 325 (65 FE) MG PO TABS: 325.0000 mg | ORAL_TABLET | Freq: Every day | ORAL | 3 refills | Status: DC

## 2022-05-13 NOTE — Discharge Summary (Signed)
DISCHARGE SUMMARY - VAGINAL DELIVERY  Admission Date: 05/11/2022 Discharge Date: 05/13/2022 Attending Physician: Horald Pollen, MD Primary Care Physician: Loura Pardon, MD  Admitting Diagnosis: Labor and delivery, indication for care [O75.9] Discharge Diagnosis:  Principal Problem:   Labor and delivery, indication for care  Infant's informations: Information for the patient's newborn:  Kanon, Colunga [[id number]]  female .birthwt: Information for the patient's newborn:  Faviola, Klare [[id number]]  @birthwt @ .apgar91min: Information for the patient's newborn:  Panhia, Karl Marlyn Corporal  9 .apgar26min: Information for the patient's newborn:  Jossilyn, Benda Marlyn Corporal  10  Summary of History and Hospital Course:  Kristina Rubio is a 31 y.o. female with an Estimated Date of Delivery: 05/08/22 who presented Present on Admission:  Labor and delivery, indication for care .  Patient Kristina Rubio is doing well. She denies chest pain, shortness of breast, nausea, vomiting or calf pain. Pain is controlled on her current pain medications. Lochia is improving.  She is ambulating without difficulty.  She is voiding without difficulty.    OBJECTIVE: Vitals:   05/12/22 1554 05/12/22 2018 05/13/22 0026 05/13/22 0914  BP: 112/61 125/74 112/72 125/75  Pulse: 93 97 80 83  Resp: 18 20  18   Temp: 98.1 F (36.7 C) 98.2 F (36.8 C) 97.8 F (36.6 C)   TempSrc: Oral Oral Oral   SpO2: 99% 100% 98% 99%  Weight:      Height:        Physical Exam  Vitals signs and nursing note reviewed.  Constitutional:      General: She is not in acute distress.    Appearance: She is well-developed.  HENT:     Head: Normocephalic and atraumatic.  Cardiovascular:     Rate and Rhythm: Normal rate and regular rhythm.     Heart sounds: Normal heart sounds.  Pulmonary:     Effort: Pulmonary effort is normal. No respiratory distress.  Musculoskeletal:        General:  No tenderness.  Skin:    General: Skin is warm and dry.  Neurological:     Mental Status: She is alert and oriented to person, place, and time.    No intake or output data in the 24 hours ending 05/13/22 0959  Recent Labs  Lab 05/11/22 0932 05/11/22 1751 05/12/22 0547  WBC 9.3 11.2* 9.6  HGB 12.3 9.9* 8.9*  HCT 37.1 29.8* 26.5*  PLT 254 243 205     Rh pos/ Rubella pos   Delivery Type:  SVD   Complications: none acute  Discharged Condition: stable Discharge Medications:   Allergies as of 05/13/2022   No Known Allergies      Medication List     TAKE these medications    ferrous sulfate 325 (65 FE) MG tablet Take 1 tablet (325 mg total) by mouth daily with breakfast.   multivitamin-prenatal 27-0.8 MG Tabs tablet Take 1 tablet by mouth daily at 12 noon.       Patient Instructions:  Activity: activity as tolerated, nothing in vagina x 6 weeks Diet: general Follow-up: in  6 weeks for postpartum check. Call with increased bleeding or cramping, fever, chills or any other complaints Signed: Savino Whisenant, 05/13/2022 9:59 AM

## 2022-05-13 NOTE — Progress Notes (Signed)
Pt given discharge instructions including prescriptions and follow up care. Pt verbalized understanding and all questions were answered. VSS. IV removed. Pt discharged with husband and baby.

## 2022-05-13 NOTE — Discharge Instructions (Signed)
Please call for temperature greater than 100.4, abnormal bleeding greater than one pad per hour, chest pain, shortness of breath, nausea, vomiting, or calf pain, foul smelling discharge or redness, swelling, or drainage from incision. Call for severe mood changes.

## 2022-05-14 LAB — BPAM RBC
Blood Product Expiration Date: 202306282359
Blood Product Expiration Date: 202306282359
Blood Product Expiration Date: 202306282359
Blood Product Expiration Date: 202306282359
ISSUE DATE / TIME: 202306091816
Unit Type and Rh: 5100
Unit Type and Rh: 5100
Unit Type and Rh: 5100
Unit Type and Rh: 5100

## 2022-05-14 LAB — TYPE AND SCREEN
ABO/RH(D): O POS
Antibody Screen: NEGATIVE
Unit division: 0
Unit division: 0
Unit division: 0
Unit division: 0

## 2022-05-14 LAB — PREPARE RBC (CROSSMATCH)

## 2022-05-15 ENCOUNTER — Encounter: Payer: Self-pay | Admitting: Obstetrics and Gynecology

## 2022-06-14 NOTE — Progress Notes (Signed)
   OBSTETRICS POSTPARTUM CLINIC PROGRESS NOTE  Subjective:     Kristina Rubio is a 31 y.o. G30P2002 female who presents for a postpartum visit. She is 4 week postpartum following a spontaneous vaginal delivery. I have fully reviewed the prenatal and intrapartum course. The delivery was at 40.3 gestational weeks.  Anesthesia: epidural. Immediate postpartum course was complicated by postpartum hemorrhage, s/p blood transfusion. Otherwise has been going well. Baby's course has been good. Baby is feeding by bottle - Gerber Gentle . Bleeding: patient has not not resumed menses, with No LMP recorded.  Bowel function is normal. Bladder function is normal. Patient is not sexually active. Contraception method desired is  unsure something non hormonal . Postpartum depression screening: negative.  EDPS score is 6.    The following portions of the patient's history were reviewed and updated as appropriate: allergies, current medications, past family history, past medical history, past social history, past surgical history, and problem list.  Review of Systems Pertinent items are noted in HPI.   Objective:    BP 121/79   Pulse 76   Resp 16   Ht 5\' 3"  (1.6 m)   Wt 204 lb 6.4 oz (92.7 kg)   BMI 36.21 kg/m   General:  alert and no distress   Breasts:  inspection negative, no nipple discharge or bleeding, no masses or nodularity palpable  Lungs: clear to auscultation bilaterally  Heart:  regular rate and rhythm, S1, S2 normal, no murmur, click, rub or gallop  Abdomen: soft, non-tender; bowel sounds normal; no masses,  no organomegaly.     Vulva:  normal  Vagina: normal vagina, no discharge, exudate, lesion, or erythema  Cervix:  no cervical motion tenderness and no lesions  Corpus: normal size, contour, position, consistency, mobility, non-tender  Adnexa:  normal adnexa and no mass, fullness, tenderness  Rectal Exam: Not performed.         Labs:  Lab Results  Component Value Date   HGB 8.9  (L) 05/12/2022     Assessment:   1. Postpartum care following vaginal delivery   2. Postpartum anemia   3. Other immediate postpartum hemorrhage      Plan:    1. Contraception:  Unsure .  Desires nonhormonal type of contraception but unsure of which type.  Discussed all nonhormonal options.  Patient debating between Phexxi and copper IUD.  Given information on both.  Also given a sample of Phexxi.  Patient can inform MD once decision is made. 2. Will check Hgb for h/o postpartum anemia of less than 10.  3. Follow up in:    07/12/2022, MD [facility]

## 2022-06-15 ENCOUNTER — Ambulatory Visit (INDEPENDENT_AMBULATORY_CARE_PROVIDER_SITE_OTHER): Payer: Medicaid Other | Admitting: Obstetrics and Gynecology

## 2022-06-15 ENCOUNTER — Encounter: Payer: Self-pay | Admitting: Obstetrics and Gynecology

## 2022-06-15 DIAGNOSIS — O9081 Anemia of the puerperium: Secondary | ICD-10-CM

## 2022-06-21 ENCOUNTER — Encounter: Payer: Self-pay | Admitting: Obstetrics and Gynecology

## 2022-06-25 ENCOUNTER — Telehealth: Payer: Self-pay | Admitting: Obstetrics and Gynecology

## 2022-06-25 NOTE — Telephone Encounter (Signed)
Made in error

## 2022-06-25 NOTE — Progress Notes (Unsigned)
    GYNECOLOGY PROGRESS NOTE  Subjective:    Patient ID: Kristina Rubio, female    DOB: 1991/05/08, 31 y.o.   MRN: [id number]  HPI  Patient is a 31 y.o. G99P2002 female who presents for IUD insertion   {Common ambulatory SmartLinks:19316}  Review of Systems {ros; complete:30496}   Objective:   unknown if currently breastfeeding. There is no height or weight on file to calculate BMI. General appearance: {general exam:16600} Abdomen: {abdominal exam:16834} Pelvic: {pelvic exam:16852::"cervix normal in appearance","external genitalia normal","no adnexal masses or tenderness","no cervical motion tenderness","rectovaginal septum normal","uterus normal size, shape, and consistency","vagina normal without discharge"} Extremities: {extremity exam:5109} Neurologic: {neuro exam:17854}   Assessment:   No diagnosis found.   Plan:   There are no diagnoses linked to this encounter.

## 2022-06-26 ENCOUNTER — Ambulatory Visit (INDEPENDENT_AMBULATORY_CARE_PROVIDER_SITE_OTHER): Payer: Medicaid Other | Admitting: Obstetrics and Gynecology

## 2022-06-26 ENCOUNTER — Encounter: Payer: Self-pay | Admitting: Obstetrics and Gynecology

## 2022-06-26 VITALS — BP 122/68 | HR 82 | Ht 63.0 in | Wt 207.6 lb

## 2022-06-26 DIAGNOSIS — Z30430 Encounter for insertion of intrauterine contraceptive device: Secondary | ICD-10-CM | POA: Diagnosis not present

## 2022-06-26 DIAGNOSIS — Z3042 Encounter for surveillance of injectable contraceptive: Secondary | ICD-10-CM

## 2022-06-26 MED ORDER — PARAGARD INTRAUTERINE COPPER IU IUD
1.0000 | INTRAUTERINE_SYSTEM | Freq: Once | INTRAUTERINE | Status: AC
  Administered 2022-06-26: 1 via INTRAUTERINE

## 2022-06-26 NOTE — Patient Instructions (Signed)

## 2022-07-11 ENCOUNTER — Encounter: Payer: Self-pay | Admitting: Obstetrics and Gynecology

## 2022-07-26 ENCOUNTER — Ambulatory Visit: Payer: Medicaid Other

## 2022-07-26 ENCOUNTER — Ambulatory Visit (LOCAL_COMMUNITY_HEALTH_CENTER): Payer: Medicaid Other

## 2022-07-26 DIAGNOSIS — Z719 Counseling, unspecified: Secondary | ICD-10-CM

## 2022-07-26 DIAGNOSIS — Z23 Encounter for immunization: Secondary | ICD-10-CM

## 2022-07-26 NOTE — Progress Notes (Signed)
Pt is here requesting Hepatitis B and Varicella vaccines.   Varicella given sq in Lt arm.  Pt tolerated well. VIS given.   [facility] updated and pt given 2 copies.  Spoke with patient about finding her vaccination record and checking for Hepatitis B status. Hep B titer was previously drawn and pt was given a copy of those results.  Berdie Ogren, RN

## 2022-07-31 ENCOUNTER — Encounter: Payer: Self-pay | Admitting: Obstetrics and Gynecology

## 2022-07-31 ENCOUNTER — Ambulatory Visit (INDEPENDENT_AMBULATORY_CARE_PROVIDER_SITE_OTHER): Payer: Medicaid Other | Admitting: Obstetrics and Gynecology

## 2022-07-31 VITALS — BP 129/77 | HR 79 | Resp 16 | Ht 63.0 in | Wt 213.6 lb

## 2022-07-31 DIAGNOSIS — Z30431 Encounter for routine checking of intrauterine contraceptive device: Secondary | ICD-10-CM | POA: Diagnosis not present

## 2022-07-31 DIAGNOSIS — O9081 Anemia of the puerperium: Secondary | ICD-10-CM

## 2022-07-31 NOTE — Progress Notes (Signed)
    GYNECOLOGY OFFICE ENCOUNTER NOTE  History:  31 y.o. [id number] here today for today for IUD string check; [name]  IUD was placed  06/26/2022. No complaints about the IUD, no concerning side effects.  The following portions of the patient's history were reviewed and updated as appropriate: allergies, current medications, past family history, past medical history, past social history, past surgical history and problem list. Last pap smear on 10/31/2021 was normal, negative HRHPV.  Review of Systems:  Pertinent items are noted in HPI.  Objective:  Physical Exam Blood pressure 129/77, pulse 79, resp. rate 16, height 5\' 3"  (1.6 m), weight 213 lb 9.6 oz (96.9 kg), not currently breastfeeding.  Body mass index is 37.84 kg/m.  CONSTITUTIONAL: Well-developed, well-nourished female in no acute distress.  NEUROLOGIC: Alert and oriented to person, place, and time. Normal reflexes, muscle tone coordination.  ABDOMEN: Soft, no distention noted.   PELVIC: Normal appearing external genitalia; normal appearing vaginal mucosa and cervix.  IUD strings visualized, about 3 cm in length outside cervix. Done in the presence of a chaperone.  EXTREMITIES: Non-tender, no edema or cyanosis  Assessment & Plan:  - Patient to keep IUD in place for up to 10 years; can come in for removal if she desires pregnancy earlier or for any concerning side effects. - Return to clinic for any scheduled appointments or for any gynecologic concerns as needed.     , MD [facility]

## 2022-08-01 LAB — HEMOGLOBIN AND HEMATOCRIT, BLOOD
Hematocrit: 36.2 % (ref 34.0–46.6)
Hemoglobin: 12 g/dL (ref 11.1–15.9)

## 2022-08-07 ENCOUNTER — Encounter: Payer: Medicaid Other | Admitting: Nurse Practitioner

## 2022-08-09 NOTE — Progress Notes (Deleted)
There were no vitals taken for this visit.   Subjective:    Patient ID: Kristina Rubio, female    DOB: 1991-01-15, 31 y.o.   MRN: [id number]  HPI: Kristina Rubio is a 31 y.o. female presenting on 08/10/2022 for comprehensive medical examination. Current medical complaints include:{Blank single:19197::"none","***"}  She currently lives with: Menopausal Symptoms: {Blank single:19197::"yes","no"}  Depression Screen done today and results listed below:     10/31/2021   10:18 AM 10/24/2021    2:15 PM 09/11/2021   10:04 AM  Depression screen PHQ 2/9  Decreased Interest 0 0 0  Down, Depressed, Hopeless 0 0 0  PHQ - 2 Score 0 0 0  Altered sleeping  0 0  Tired, decreased energy  0 0  Change in appetite  0 0  Feeling bad or failure about yourself   0 0  Trouble concentrating  0 0  Moving slowly or fidgety/restless  0 0  Suicidal thoughts  0 0  PHQ-9 Score  0 0  Difficult doing work/chores  Not difficult at all Not difficult at all    The patient {has/does not have:19849} a history of falls. I {did/did not:19850} complete a risk assessment for falls. A plan of care for falls {was/was not:19852} documented.   Past Medical History:  Past Medical History:  Diagnosis Date   PCOS (polycystic ovarian syndrome)     Surgical History:  Past Surgical History:  Procedure Laterality Date   NO PAST SURGERIES      Medications:  Current Outpatient Medications on File Prior to Visit  Medication Sig   ferrous sulfate 325 (65 FE) MG tablet Take 1 tablet (325 mg total) by mouth daily with breakfast.   [name] INTRAUTERINE COPPER IU 1 Intra Uterine Device by Intrauterine route once. INSERTED 06/26/2022   Prenatal Vit-Fe Fumarate-FA (MULTIVITAMIN-PRENATAL) 27-0.8 MG TABS tablet Take 1 tablet by mouth daily at 12 noon.   No current facility-administered medications on file prior to visit.    Allergies:  No Known Allergies  Social History:  Social History   Socioeconomic  History   Marital status: Married    Spouse name: Gerilyn Nestle   Number of children: 1   Years of education: Not on file   Highest education level: Not on file  Occupational History   Not on file  Tobacco Use   Smoking status: Never   Smokeless tobacco: Never  Vaping Use   Vaping Use: Never used  Substance and Sexual Activity   Alcohol use: Not Currently    Alcohol/week: 1.0 standard drink of alcohol    Types: 1 Standard drinks or equivalent per week    Comment: occassional   Drug use: No   Sexual activity: Yes    Comment: undecided  Other Topics Concern   Not on file  Social History Narrative   Not on file   Social Determinants of Health   Financial Resource Strain: Not on file  Food Insecurity: Not on file  Transportation Needs: Not on file  Physical Activity: Not on file  Stress: Not on file  Social Connections: Not on file  Intimate Partner Violence: Not on file   Social History   Tobacco Use  Smoking Status Never  Smokeless Tobacco Never   Social History   Substance and Sexual Activity  Alcohol Use Not Currently   Alcohol/week: 1.0 standard drink of alcohol   Types: 1 Standard drinks or equivalent per week   Comment: occassional    Family History:  Family History  Problem Relation Age of Onset   Anemia Mother    Healthy Father    Colon cancer Maternal Grandmother    Breast cancer Neg Hx     Past medical history, surgical history, medications, allergies, family history and social history reviewed with patient today and changes made to appropriate areas of the chart.   ROS All other ROS negative except what is listed above and in the HPI.      Objective:    There were no vitals taken for this visit.  Wt Readings from Last 3 Encounters:  07/31/22 213 lb 9.6 oz (96.9 kg)  06/26/22 207 lb 9.6 oz (94.2 kg)  06/15/22 204 lb 6.4 oz (92.7 kg)    Physical Exam  Results for orders placed or performed in visit on 07/31/22  Hemoglobin and hematocrit,  blood  Result Value Ref Range   Hemoglobin 12.0 11.1 - 15.9 g/dL   Hematocrit 26.9 48.5 - 46.6 %      Assessment & Plan:   Problem List Items Addressed This Visit   None    Follow up plan: No follow-ups on file.   LABORATORY TESTING:  - Pap smear: {Blank single:19197::"pap done","not applicable","up to date","done elsewhere"}  IMMUNIZATIONS:   - Tdap: Tetanus vaccination status reviewed: {tetanus status:315746}. - Influenza: {Blank single:19197::"Up to date","Administered today","Postponed to flu season","Refused","Given elsewhere"} - Pneumovax: {Blank single:19197::"Up to date","Administered today","Not applicable","Refused","Given elsewhere"} - Prevnar: {Blank single:19197::"Up to date","Administered today","Not applicable","Refused","Given elsewhere"} - COVID: {Blank single:19197::"Up to date","Administered today","Not applicable","Refused","Given elsewhere"} - HPV: {Blank single:19197::"Up to date","Administered today","Not applicable","Refused","Given elsewhere"} - Shingrix vaccine: {Blank single:19197::"Up to date","Administered today","Not applicable","Refused","Given elsewhere"}  SCREENING: -Mammogram: {Blank single:19197::"Up to date","Ordered today","Not applicable","Refused","Done elsewhere"}  - Colonoscopy: {Blank single:19197::"Up to date","Ordered today","Not applicable","Refused","Done elsewhere"}  - Bone Density: {Blank single:19197::"Up to date","Ordered today","Not applicable","Refused","Done elsewhere"}  -Hearing Test: {Blank single:19197::"Up to date","Ordered today","Not applicable","Refused","Done elsewhere"}  -Spirometry: {Blank single:19197::"Up to date","Ordered today","Not applicable","Refused","Done elsewhere"}   PATIENT COUNSELING:   Advised to take 1 mg of folate supplement per day if capable of pregnancy.   Sexuality: Discussed sexually transmitted diseases, partner selection, use of condoms, avoidance of unintended pregnancy  and contraceptive  alternatives.   Advised to avoid cigarette smoking.  I discussed with the patient that most people either abstain from alcohol or drink within safe limits (<=14/week and <=4 drinks/occasion for males, <=7/weeks and <= 3 drinks/occasion for females) and that the risk for alcohol disorders and other health effects rises proportionally with the number of drinks per week and how often a drinker exceeds daily limits.  Discussed cessation/primary prevention of drug use and availability of treatment for abuse.   Diet: Encouraged to adjust caloric intake to maintain  or achieve ideal body weight, to reduce intake of dietary saturated fat and total fat, to limit sodium intake by avoiding high sodium foods and not adding table salt, and to maintain adequate dietary potassium and calcium preferably from fresh fruits, vegetables, and low-fat dairy products.    stressed the importance of regular exercise  Injury prevention: Discussed safety belts, safety helmets, smoke detector, smoking near bedding or upholstery.   Dental health: Discussed importance of regular tooth brushing, flossing, and dental visits.    NEXT PREVENTATIVE PHYSICAL DUE IN 1 YEAR. No follow-ups on file.

## 2022-08-10 ENCOUNTER — Encounter: Payer: Medicaid Other | Admitting: Nurse Practitioner

## 2022-11-20 ENCOUNTER — Encounter: Payer: Self-pay | Admitting: Obstetrics and Gynecology

## 2022-11-20 MED ORDER — METFORMIN HCL 500 MG PO TABS: 500.0000 mg | ORAL_TABLET | Freq: Two times a day (BID) | ORAL | 3 refills | Status: AC

## 2022-12-31 ENCOUNTER — Encounter: Payer: Self-pay | Admitting: Obstetrics and Gynecology

## 2023-01-09 NOTE — Telephone Encounter (Signed)
I contacted patient via phone. Patient is scheduled for 2/21 with [facility]

## 2023-01-22 NOTE — Progress Notes (Deleted)
    GYNECOLOGY PROGRESS NOTE  Subjective:    Patient ID: Kristina Rubio, female    DOB: 1991-11-01, 32 y.o.   MRN: [id number]  HPI  Patient is a 32 y.o. G66P2002 female who presents for evaluation of bleeding with IUD.  {Common ambulatory SmartLinks:19316}  Review of Systems {ros; complete:30496}   Objective:   not currently breastfeeding. There is no height or weight on file to calculate BMI. General appearance: {general exam:16600} Abdomen: {abdominal exam:16834} Pelvic: {pelvic exam:16852::"cervix normal in appearance","external genitalia normal","no adnexal masses or tenderness","no cervical motion tenderness","rectovaginal septum normal","uterus normal size, shape, and consistency","vagina normal without discharge"} Extremities: {extremity exam:5109} Neurologic: {neuro exam:17854}   Assessment:   No diagnosis found.   Plan:   There are no diagnoses linked to this encounter.     Kristina Maid, MD [place]

## 2023-01-23 ENCOUNTER — Ambulatory Visit: Payer: Medicaid Other | Admitting: Obstetrics and Gynecology

## 2023-04-04 NOTE — Progress Notes (Signed)
    GYNECOLOGY PROGRESS NOTE  Subjective:    Patient ID: Kristina Rubio, female    DOB: [birth year], 32 y.o.   MRN: [id number]  HPI  Patient is a 32 y.o. G85P2002 female who presents for evaluation of spotting to light bleeding with her IUD. She had a Paraguard IUD placed on 07/31/2022. She reports that she has been bleeding everyday since she had the Paraguard placed.  States that it is difficult sometimes to know when her actual menses starts.  Patient's last menstrual period was 03/17/2023.  Of note, patient does have history of PCOS.   The following portions of the patient's history were reviewed and updated as appropriate: allergies, current medications, past family history, past medical history, past social history, past surgical history, and problem list.  Review of Systems Pertinent items noted in HPI and remainder of comprehensive ROS otherwise negative.   Objective:   Blood pressure 125/77, pulse 98, resp. rate 16, height 5\' 3"  (1.6 m), weight 211 lb 11.2 oz (96 kg), last menstrual period 03/17/2023, not currently breastfeeding. Body mass index is 37.5 kg/m. General appearance: alert and no distress Abdomen: soft, non-tender; bowel sounds normal; no masses,  no organomegaly Pelvic: external genitalia normal, rectovaginal septum normal.  Vagina without discharge.  Scant dark red blood in vaginal vault.  Cervix normal appearing, no lesions and no motion tenderness.  IUD threads visible, proximately 3 cm in length from external cervical os.  Uterus mobile, nontender, normal shape and size.  Adnexae non-palpable, nontender bilaterally.    Labs:  Results for orders placed or performed in visit on 04/05/23  POCT hemoglobin  Result Value Ref Range   Hemoglobin 13.7 11 - 14.6 g/dL     Assessment:   1. Dysfunctional uterine bleeding   2. IUD check up   3. PCOS (polycystic ovarian syndrome)      Plan:   -Discussed with patient that bleeding is most likely secondary to her  history of PCOS which can lead to irregular menses, and not being able to be regulated with the nonhormonal IUD.  Patient also had IUD placed after birth of her last child, when hormones are typically more in disarray.  Discussed her options with her, including changing to a hormonal IUD, use of supplemental hormones on top of the IUD with either combined OCPs for 3 months to regulate her cycles or Provera challenge to stop current episode of bleeding and can use intermittently.  Patient desires to try Provera.  Prescription sent. - POCT Hgb checked due to patient's prolonged h/o bleeding, normal today.  -RTC if symptoms worsen or fail to improve.  Hildred Laser, MD [facility]

## 2023-04-05 ENCOUNTER — Encounter: Payer: Self-pay | Admitting: Obstetrics and Gynecology

## 2023-04-05 ENCOUNTER — Ambulatory Visit (INDEPENDENT_AMBULATORY_CARE_PROVIDER_SITE_OTHER): Payer: Medicaid Other | Admitting: Obstetrics and Gynecology

## 2023-04-05 DIAGNOSIS — Z30431 Encounter for routine checking of intrauterine contraceptive device: Secondary | ICD-10-CM

## 2023-04-05 DIAGNOSIS — E282 Polycystic ovarian syndrome: Secondary | ICD-10-CM | POA: Diagnosis not present

## 2023-04-05 DIAGNOSIS — N938 Other specified abnormal uterine and vaginal bleeding: Secondary | ICD-10-CM

## 2023-04-05 LAB — POCT HEMOGLOBIN: Hemoglobin: 13.7 g/dL (ref 11–14.6)

## 2023-04-05 MED ORDER — MEDROXYPROGESTERONE ACETATE 10 MG PO TABS: 10.0000 mg | ORAL_TABLET | Freq: Every day | ORAL | 2 refills | Status: DC

## 2023-04-29 ENCOUNTER — Encounter: Payer: Self-pay | Admitting: Obstetrics and Gynecology

## 2023-10-01 NOTE — Patient Instructions (Signed)
 Levonorgestrel Intrauterine Device What is this medication? LEVONORGESTREL (LEE voe nor jes trel) prevents ovulation and pregnancy. It may also be used to treat heavy periods. It belongs to a group of medications called contraceptives. This medication is a progestin hormone. This medicine may be used for other purposes; ask your health care provider or pharmacist if you have questions. COMMON BRAND NAME(S): Cameron Kristina Rubio What should I tell my care team before I take this medication? They need to know if you have any of these conditions: Abnormal Pap smear Cancer of the breast, uterus, or cervix Diabetes Endometritis Genital or pelvic infection now or in the past Have more than one sexual partner or your partner has more than one partner Heart disease History of an ectopic or tubal pregnancy Immune system problems IUD in place Liver disease or tumor Problems with blood clots or take blood-thinners Seizures Use intravenous drugs Uterus of unusual shape Vaginal bleeding that has not been explained An unusual or allergic reaction to levonorgestrel, other hormones, silicone, or polyethylene, medications, foods, dyes, or preservatives Pregnant or trying to get pregnant Breast-feeding How should I use this medication? This device is placed inside the uterus by your care team. A patient package insert for the product will be given each time it is inserted. Be sure to read this information carefully each time. The sheet may change often. Talk to your care team about use of this medication in children. Special care may be needed. Overdosage: If you think you have taken too much of this medicine contact a poison control center or emergency room at once. NOTE: This medicine is only for you. Do not share this medicine with others. What if I miss a dose? This does not apply. Depending on the brand of device you have inserted, the device will need to be replaced every 3 to 8 years  if you wish to continue using this type of birth control. What may interact with this medication? Interactions are not expected. Tell your care team about all the medications you take. This list may not describe all possible interactions. Give your health care provider a list of all the medicines, herbs, non-prescription drugs, or dietary supplements you use. Also tell them if you smoke, drink alcohol, or use illegal drugs. Some items may interact with your medicine. What should I watch for while using this medication? Visit your care team for regular check-ups. Tell your care team if you or your partner becomes HIV positive or gets a sexually transmitted disease. Using this medication does not protect you or your partner against HIV or other sexually transmitted infections (STIs). You can check the placement of the IUD yourself by reaching up to the top of your vagina with clean fingers to feel the threads. Do not pull on the threads. It is a good habit to check placement after each menstrual period. Call your care team right away if you feel more of the IUD than just the threads or if you cannot feel the threads at all. The IUD may come out by itself. You may become pregnant if the device comes out. If you notice that the IUD has come out use a backup birth control method like condoms and call your care team. Using tampons will not change the position of the IUD and are okay to use during your period. This IUD can be safely scanned with magnetic resonance imaging (MRI) only under specific conditions. Before you have an MRI, tell your care team that  you have an IUD in place, and which type of IUD you have in place. What side effects may I notice from receiving this medication? Side effects that you should report to your care team as soon as possible: Allergic reactions--skin rash, itching, hives, swelling of the face, lips, tongue, or throat Blood clot--pain, swelling, or warmth in the leg, shortness  of breath, chest pain Gallbladder problems--severe stomach pain, nausea, vomiting, fever Increase in blood pressure Liver injury--right upper belly pain, loss of appetite, nausea, light-colored stool, dark yellow or brown urine, yellowing skin or eyes, unusual weakness or fatigue New or worsening migraines or headaches Pelvic inflammatory disease (PID)--fever, abdominal pain, pelvic pain, pain or trouble passing urine, spotting, bleeding during or after sex Stroke--sudden numbness or weakness of the face, arm, or leg, trouble speaking, confusion, trouble walking, loss of balance or coordination, dizziness, severe headache, change in vision Unusual vaginal discharge, itching, or odor Vaginal pain, irritation, or sores Worsening mood, feelings of depression Side effects that usually do not require medical attention (report to your care team if they continue or are bothersome): Breast pain or tenderness Dark patches of skin on the face or other sun-exposed areas Irregular menstrual cycles or spotting Nausea Weight gain This list may not describe all possible side effects. Call your doctor for medical advice about side effects. You may report side effects to FDA at 1-800-FDA-1088. Where should I keep my medication? This does not apply. NOTE: This sheet is a summary. It may not cover all possible information. If you have questions about this medicine, talk to your doctor, pharmacist, or health care provider.  2024 Elsevier/Gold Standard (2021-08-02 00:00:00)

## 2023-10-01 NOTE — Progress Notes (Addendum)
   Addended to add signature  GYNECOLOGY OFFICE PROCEDURE NOTE  Kristina Rubio is a 32 y.o. [id number] here for Paraguard IUD removal and Mirena insertion. [name] inserted after last delivery by Dr. Valentino Saxon, started giving her heavy periods to the point that she is anemic and her PCP recommended she switch to the hormonal IUD.  Last pap smear was on 10/31/21 and was normal.    IUD Removal and Reinsertion  Patient identified, informed consent performed, consent signed.   Discussed risks of irregular bleeding, cramping, infection, malpositioning or misplacement of the IUD outside the uterus which may require further procedures. Also discussed >99% contraception efficacy, increased risk of ectopic pregnancy with failure of method.   Emphasized that this did not protect against STIs, condoms recommended during all sexual encounters. Advised to use backup contraception for one week as the risk of pregnancy is higher during the transition period of removing an IUD and replacing it with another one. Time out was performed. Speculum placed in the vagina. The strings of the IUD were grasped and pulled using ring forceps. The [name] strings broke off from the device.  The cervix was cleaned with Betadine x 2 and grasped anteriorly with a single tooth tenaculum.  After several attempts with the IUD removal tool, and a tonsil forcep, the IUD was successfully removed in its entirety with Dr. Logan Bores' assistance, using a long, curved Kelly forcep. The new Mirena IUD insertion apparatus was used to sound the uterus to 8 cm;  the IUD was then placed per manufacturer's recommendations. Strings trimmed to 3 cm. Tenaculum was removed, good hemostasis noted. Patient tolerated procedure well.   Patient was given post-procedure instructions.  She was reminded to have backup contraception for one week during this transition period between IUDs.  Patient was also asked to check IUD strings periodically and follow up in 4 weeks  for IUD check.   Julieanne Manson, DO [facility]

## 2023-10-02 ENCOUNTER — Ambulatory Visit: Payer: Medicaid Other | Admitting: Obstetrics

## 2023-10-02 ENCOUNTER — Encounter: Payer: Self-pay | Admitting: Obstetrics

## 2023-10-02 VITALS — BP 130/80 | HR 90 | Ht 63.0 in | Wt 210.0 lb

## 2023-10-02 DIAGNOSIS — N938 Other specified abnormal uterine and vaginal bleeding: Secondary | ICD-10-CM

## 2023-10-02 DIAGNOSIS — Z30433 Encounter for removal and reinsertion of intrauterine contraceptive device: Secondary | ICD-10-CM

## 2023-10-02 MED ORDER — LEVONORGESTREL 20 MCG/DAY IU IUD
1.0000 | INTRAUTERINE_SYSTEM | Freq: Once | INTRAUTERINE | Status: AC
  Administered 2023-10-02: 1 via INTRAUTERINE

## 2023-10-11 ENCOUNTER — Ambulatory Visit: Payer: Medicaid Other | Admitting: Obstetrics and Gynecology

## 2023-11-10 ENCOUNTER — Encounter: Payer: Self-pay | Admitting: Obstetrics

## 2023-11-11 NOTE — Telephone Encounter (Signed)
Patient has been bleeding straight for 16 days. She had an IUD placed on 10/02/23. Wondering if she can have anything to stop her bleeding, please advise

## 2023-11-13 ENCOUNTER — Other Ambulatory Visit: Payer: Self-pay

## 2023-11-13 DIAGNOSIS — N921 Excessive and frequent menstruation with irregular cycle: Secondary | ICD-10-CM

## 2023-11-13 MED ORDER — IBUPROFEN 800 MG PO TABS: ORAL_TABLET | ORAL | 0 refills | Status: DC

## 2023-11-13 MED ORDER — IBUPROFEN 800 MG PO TABS: ORAL_TABLET | ORAL | 0 refills | Status: AC

## 2023-11-13 NOTE — Addendum Note (Signed)
Addended by: Julieanne Manson on: 11/13/2023 11:25 AM   Modules accepted: Orders

## 2023-11-13 NOTE — Progress Notes (Signed)
Ibuprofen 800mg  TID prn x 7 days, #21, no refills per [facility]

## 2024-02-18 NOTE — Progress Notes (Unsigned)
    GYNECOLOGY PROGRESS NOTE  Subjective:  PCP: Loura Pardon, MD (Inactive)  Patient ID: Kristina Rubio, female    DOB: [birth year], 33 y.o.   MRN: [id number]  HPI  Patient is a 33 y.o. G42P2002 female who presents for bleeding with Mirena IUD. IUD was placed Oct 2024 after becoming anemic with [name]. Since insertion, she continues to have almost daily bleeding, is light and like spotting. Will stop for 1-2 days, but then start back again. Sometimes feels like she may be having a period, bleeding gets heavier and she has cramps. She wants to leave IUD in place as contraception, but the almost non-stop spotting is becoming bothersome. Last pap smear was on 10/31/21 and was normal.   The following portions of the patient's history were reviewed and updated as appropriate: allergies, current medications, past family history, past medical history, past social history, past surgical history, and problem list.  Review of Systems Pertinent items are noted in HPI.   Objective:   Blood pressure 120/79, pulse 77, height 5\' 3"  (1.6 m), weight 212 lb (96.2 kg), not currently breastfeeding. Body mass index is 37.55 kg/m.  General appearance: alert and cooperative Abdomen:  deferred Pelvic: deferred Extremities: extremities normal, atraumatic, no cyanosis or edema Neurologic: Grossly normal  Assessment/Plan:   1. Breakthrough bleeding with IUD      1. Breakthrough bleeding with IUD (Primary) Now in 5th month after Mirena IUD insertion -- reassured pt that IUD is still working as contraception, can sometimes take 6-9 mos before irregular bleeding regulates. We discussed adding on a short course of OCPs to help regulate the bleeding, while leaving the IUD in place as main form of contraception and she is amenable to this.  -Rx for Seasonique x 3 mos -- SE and dosing reviewed -Pelvic US ordered to ensure bleeding is not from another source, will call with results.  -If bleeding not regulated  at the end of OCP course, RTC for further treatment.   Julieanne Manson, DO [facility]

## 2024-02-19 ENCOUNTER — Ambulatory Visit: Admitting: Obstetrics

## 2024-02-19 ENCOUNTER — Encounter: Payer: Self-pay | Admitting: Obstetrics

## 2024-02-19 VITALS — BP 120/79 | HR 77 | Ht 63.0 in | Wt 212.0 lb

## 2024-02-19 DIAGNOSIS — N939 Abnormal uterine and vaginal bleeding, unspecified: Secondary | ICD-10-CM | POA: Diagnosis not present

## 2024-02-19 DIAGNOSIS — Z975 Presence of (intrauterine) contraceptive device: Secondary | ICD-10-CM

## 2024-02-19 MED ORDER — LEVONORGEST-ETH ESTRAD 91-DAY 0.15-0.03 &0.01 MG PO TABS: 1.0000 | ORAL_TABLET | Freq: Every day | ORAL | 0 refills | Status: AC

## 2024-02-24 ENCOUNTER — Ambulatory Visit
Admission: RE | Admit: 2024-02-24 | Discharge: 2024-02-24 | Disposition: A | Discharge status: Home / Self Care | Source: Ambulatory Visit | Attending: Obstetrics | Admitting: Obstetrics

## 2024-02-24 DIAGNOSIS — Z975 Presence of (intrauterine) contraceptive device: Secondary | ICD-10-CM | POA: Insufficient documentation

## 2024-02-24 DIAGNOSIS — N921 Excessive and frequent menstruation with irregular cycle: Secondary | ICD-10-CM | POA: Diagnosis present

## 2024-02-27 ENCOUNTER — Encounter: Payer: Self-pay | Admitting: Obstetrics

## 2024-03-29 ENCOUNTER — Encounter: Payer: Self-pay | Admitting: Obstetrics

## 2024-07-29 ENCOUNTER — Encounter: Admitting: Family Medicine

## 2024-08-24 ENCOUNTER — Ambulatory Visit (INDEPENDENT_AMBULATORY_CARE_PROVIDER_SITE_OTHER)

## 2024-08-24 DIAGNOSIS — L650 Telogen effluvium: Secondary | ICD-10-CM | POA: Diagnosis not present

## 2024-08-24 DIAGNOSIS — L649 Androgenic alopecia, unspecified: Secondary | ICD-10-CM

## 2024-08-24 MED ORDER — SPIRONOLACTONE 50 MG PO TABS: 100.0000 mg | ORAL_TABLET | Freq: Every day | ORAL | 5 refills | Status: AC

## 2024-08-24 NOTE — Patient Instructions (Addendum)
 Start spironolactone  50 mg - take 50 mg by mouth daily for first week if not experiencing any symptoms can increase to 100 mg by mouth daily  Spironolactone  can cause increased urination and cause blood pressure to decrease. Please watch for signs of lightheadedness and be cautious when changing position. It can sometimes cause breast tenderness or an irregular period in premenopausal women. It can also increase potassium. The increase in potassium usually is not a concern unless you are taking other medicines that also increase potassium, so please be sure your doctor knows all of the other medications you are taking. This medication should not be taken by pregnant women.  This medicine should also not be taken together with sulfa drugs like Bactrim (trimethoprim/sulfamethexazole).      Due to recent changes in healthcare laws, you may see results of your pathology and/or laboratory studies on MyChart before the doctors have had a chance to review them. We understand that in some cases there may be results that are confusing or concerning to you. Please understand that not all results are received at the same time and often the doctors may need to interpret multiple results in order to provide you with the best plan of care or course of treatment. Therefore, we ask that you please give us  2 business days to thoroughly review all your results before contacting the office for clarification. Should we see a critical lab result, you will be contacted sooner.   If You Need Anything After Your Visit  If you have any questions or concerns for your doctor, please call our main line at [phone] and press option 4 to reach your doctor's medical assistant. If no one answers, please leave a voicemail as directed and we will return your call as soon as possible. Messages left after 4 pm will be answered the following business day.   You may also send us  a message via MyChart. We typically respond to MyChart  messages within 1-2 business days.  For prescription refills, please ask your pharmacy to contact our office. Our fax number is [phone].  If you have an urgent issue when the clinic is closed that cannot wait until the next business day, you can page your doctor at the number below.    Please note that while we do our best to be available for urgent issues outside of office hours, we are not available 24/7.   If you have an urgent issue and are unable to reach us , you may choose to seek medical care at your doctor's office, retail clinic, urgent care center, or emergency room.  If you have a medical emergency, please immediately call 911 or go to the emergency department.  Pager Numbers  - Dr. Hester: [phone]  - Dr. Jackquline: [phone]  - Dr. Claudene: [phone]   - Dr. Raymund: [phone]  In the event of inclement weather, please call our main line at [phone] for an update on the status of any delays or closures.  Dermatology Medication Tips: Please keep the boxes that topical medications come in in order to help keep track of the instructions about where and how to use these. Pharmacies typically print the medication instructions only on the boxes and not directly on the medication tubes.   If your medication is too expensive, please contact our office at [phone] option 4 or send us  a message through MyChart.   We are unable to tell what your co-pay for medications will be in advance as this is  different depending on your insurance coverage. However, we may be able to find a substitute medication at lower cost or fill out paperwork to get insurance to cover a needed medication.   If a prior authorization is required to get your medication covered by your insurance company, please allow us  1-2 business days to complete this process.  Drug prices often vary depending on where the prescription is filled and some pharmacies may offer cheaper prices.  The  website www.goodrx.com contains coupons for medications through different pharmacies. The prices here do not account for what the cost may be with help from insurance (it may be cheaper with your insurance), but the website can give you the price if you did not use any insurance.  - You can print the associated coupon and take it with your prescription to the pharmacy.  - You may also stop by our office during regular business hours and pick up a GoodRx coupon card.  - If you need your prescription sent electronically to a different pharmacy, notify our office through [facility] or by phone at [phone] option 4.     Si Usted Necesita Algo Despus de Su Visita  Tambin puede enviarnos un mensaje a travs de Clinical cytogeneticist. Por lo general respondemos a los mensajes de MyChart en el transcurso de 1 a 2 das hbiles.  Para renovar recetas, por favor pida a su farmacia que se ponga en contacto con nuestra oficina. Randi lakes de fax es [place] [phone].  Si tiene un asunto urgente cuando la clnica est cerrada y que no puede esperar hasta el siguiente da hbil, puede llamar/localizar a su doctor(a) al nmero que aparece a continuacin.   Por favor, tenga en cuenta que aunque hacemos todo lo posible para estar disponibles para asuntos urgentes fuera del horario de [place], no estamos disponibles las 24 horas del da, los 7 [address]  [address] de la [place].   Si tiene un problema urgente y no puede comunicarse con nosotros, puede optar por buscar atencin mdica  en el consultorio de su doctor(a), en una clnica privada, en un centro de atencin urgente o en una sala de emergencias.  Si tiene Engineer, drilling, por favor llame inmediatamente al 911 o vaya a la sala de emergencias.  Nmeros de bper  - Dr. Hester: [phone]  - Dra. Jackquline: [phone]  - Dr. Claudene: [phone]  - Dra. Kitts: [phone]  En caso de inclemencias del [place], por favor llame a nuestra lnea principal al  [phone] para una actualizacin sobre el estado de cualquier retraso o cierre.  Consejos para la medicacin en dermatologa: Por favor, guarde las cajas en las que vienen los medicamentos de uso tpico para ayudarle a seguir las instrucciones sobre dnde y cmo usarlos. Las farmacias generalmente imprimen las instrucciones del medicamento slo en las cajas y no directamente en los tubos del [place].   Si su medicamento es muy caro, por favor, pngase en contacto con landry rieger llamando al [phone] y presione la opcin 4 o envenos un mensaje a travs de Clinical cytogeneticist.   No podemos decirle cul ser su copago por los medicamentos por adelantado ya que esto es diferente dependiendo de la cobertura de su seguro. Sin embargo, es posible que podamos encontrar un medicamento sustituto a Audiological scientist un formulario para que el seguro cubra el medicamento que se considera necesario.   Si se requiere una autorizacin previa para que su compaa de seguros malta su medicamento, por favor permtanos de 1 a  2 das hbiles para completar [address]  Los precios de los medicamentos varan con frecuencia dependiendo del Environmental consultant de dnde se surte la receta y alguna farmacias pueden ofrecer precios ms baratos.  El sitio web www.goodrx.com tiene cupones para medicamentos de Health and safety inspector. Los precios aqu no tienen en cuenta lo que podra costar con la ayuda del seguro (puede ser ms barato con su seguro), pero el sitio web puede darle el precio si no utiliz Tourist information centre manager.  - Puede imprimir el cupn correspondiente y llevarlo con su receta a la farmacia.  - Tambin puede pasar por nuestra oficina durante el horario de atencin regular y Education officer, museum una tarjeta de cupones de GoodRx.  - Si necesita que su receta se enve electrnicamente a una farmacia diferente, informe a nuestra oficina a travs de MyChart de [place] o por telfono llamando al [phone] y presione la opcin 4.

## 2024-08-24 NOTE — Progress Notes (Signed)
 Subjective   Kristina Rubio is a 33 y.o. female who presents for the following: hair loss. Patient is new patient  Today patient reports: Patient here today concerning hairloss. Noticed for over 3 years.  Noticing on hairloss on front scalp  Patient reports she has PCOS was prescribed Metformin .  Did start after COVID infection a few years ago. Worsened after pregnancy   Denies any heart or lung issues   Patient tried using topical rogaine for both men and women, but can not tell if helping.  Review of Systems:    No other skin or systemic complaints except as noted in HPI or Assessment and Plan.  The following portions of the chart were reviewed this encounter and updated as appropriate: medications, allergies, medical history  Relevant Medical History:  PCOS  Objective  Well appearing patient in no apparent distress; mood and affect are within normal limits. Examination was performed of the: scalp Examination notable for: SKIN EXAM  - Thinning of hair on frontal and vertex of the scalp with retention of frontal hairline and widening of the part.  No erythema, no scale, no scarring, no papules, and no pustules on exam today.     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 Condition is symptomatic and bothersome to patient. Patient is flaring and not currently at treatment goal.  - Explained to the patient that this represents temporary hair loss/shedding of resting/telogen hairs after some type of shock to the system such as illness, childbirth, new medication, surgery - Expect some component of androgenetic alopecia secondary to PCOS - Recent labs reviewed - TSH, CBC, CMP wnl  - Discussed initiation of oral minoxidil vs spironolactone . Discussed spironolactone  may have some benefit in PCOS - opted to start this.   - BP 118/80  - recent potassium  wnl  - Start spironolactone  100 mg PO daily (max dose: 100mg  BID) - advised to start 50 mg daily and titrate up  - Discussed risk of benefits of this medication as well as common side effects. Explained that the most common side effect is polyuria which is why we recommend she take the medication in the morning. Other side effects include dizziness, breast tenderness/enlargement (which usually resolves after 1 month), and headaches. Also informed the patient that there is a chance this medication could increase her potassium and we will require occasional blood tests.  - Discussed pregnancy category X and patient should discontinue the medication if trying to conceive or if becomes pregnant. - Screened for renal and cardiac dz and personal and family history of breast cancer - No need to screen for underlying hyperkalemia except if >7 years old, h/o renal or cardiac disease, underlying hepatic function, high doses (>200mg /day), or on ACEi's/NSAIDs/Bactrim Minoxidil 5% twice daily. Educated about proper use, including ensuring the medication is applied directly to the scalp. Discusssed that minoxidil can initially cause some hair shedding. Reviewed that expectation and goal of treatment is to prevent further loss, rather than hair regrowth.  Spironolactone  can cause increased urination and cause blood pressure to decrease. Please watch for signs of lightheadedness and be cautious when changing position. It can sometimes cause breast tenderness or an irregular period in premenopausal women. It can also increase potassium. The increase in potassium usually is not a concern unless you are taking other medicines that also increase potassium, so please be sure your doctor knows  all of the other medications you are taking. This medication should not be taken by pregnant women.  This medicine should also not be taken together with sulfa drugs like Bactrim (trimethoprim/sulfamethexazole).     Procedures, orders,  diagnosis for this visit:  TELOGEN EFFLUVIUM   Related Medications spironolactone  (ALDACTONE ) 50 MG tablet Take 2 tablets (100 mg total) by mouth daily. ANDROGENETIC ALOPECIA    Telogen effluvium -     Spironolactone ; Take 2 tablets (100 mg total) by mouth daily.  Dispense: 60 tablet; Refill: 5  Androgenetic alopecia    Return to clinic: Return in about 6 months (around 02/21/2025) for hair loss.  Documentation: I have reviewed the above documentation for accuracy and completeness, and I agree with the above.  I, Eleanor Blush, CMA, am acting as scribe for Lauraine JAYSON Kanaris, MD.  Lauraine JAYSON Kanaris, MD

## 2025-02-22 ENCOUNTER — Ambulatory Visit
